# Patient Record
Sex: Female | Born: 1945 | Race: Black or African American | Hispanic: No | Marital: Single | Smoking: Never smoker
Health system: Southern US, Community
[De-identification: ages and names within clinical notes are randomized; demographics above are authoritative.]

## PROBLEM LIST (undated history)

## (undated) DIAGNOSIS — H9319 Tinnitus, unspecified ear: Secondary | ICD-10-CM

## (undated) DIAGNOSIS — M47812 Spondylosis without myelopathy or radiculopathy, cervical region: Secondary | ICD-10-CM

## (undated) DIAGNOSIS — I10 Essential (primary) hypertension: Secondary | ICD-10-CM

## (undated) DIAGNOSIS — H2510 Age-related nuclear cataract, unspecified eye: Secondary | ICD-10-CM

## (undated) DIAGNOSIS — H1045 Other chronic allergic conjunctivitis: Secondary | ICD-10-CM

## (undated) DIAGNOSIS — K429 Umbilical hernia without obstruction or gangrene: Secondary | ICD-10-CM

## (undated) DIAGNOSIS — M5090 Cervical disc disorder, unspecified, unspecified cervical region: Secondary | ICD-10-CM

## (undated) DIAGNOSIS — E440 Moderate protein-calorie malnutrition: Secondary | ICD-10-CM

## (undated) DIAGNOSIS — E785 Hyperlipidemia, unspecified: Secondary | ICD-10-CM

## (undated) DIAGNOSIS — H5203 Hypermetropia, bilateral: Secondary | ICD-10-CM

## (undated) DIAGNOSIS — N393 Stress incontinence (female) (male): Secondary | ICD-10-CM

## (undated) DIAGNOSIS — L659 Nonscarring hair loss, unspecified: Secondary | ICD-10-CM

## (undated) DIAGNOSIS — R011 Cardiac murmur, unspecified: Secondary | ICD-10-CM

## (undated) DIAGNOSIS — M172 Bilateral post-traumatic osteoarthritis of knee: Secondary | ICD-10-CM

## (undated) DIAGNOSIS — N3946 Mixed incontinence: Secondary | ICD-10-CM

## (undated) DIAGNOSIS — J309 Allergic rhinitis, unspecified: Secondary | ICD-10-CM

## (undated) DIAGNOSIS — H40019 Open angle with borderline findings, low risk, unspecified eye: Secondary | ICD-10-CM

## (undated) DIAGNOSIS — E669 Obesity, unspecified: Secondary | ICD-10-CM

## (undated) DIAGNOSIS — M5430 Sciatica, unspecified side: Secondary | ICD-10-CM

## (undated) DIAGNOSIS — D649 Anemia, unspecified: Secondary | ICD-10-CM

## (undated) DIAGNOSIS — H524 Presbyopia: Secondary | ICD-10-CM

## (undated) HISTORY — DX: Obesity, unspecified: E66.9

## (undated) HISTORY — DX: Essential (primary) hypertension: I10

## (undated) HISTORY — DX: Cardiac murmur, unspecified: R01.1

## (undated) HISTORY — PX: KNEE ARTHROSCOPY: SUR90

## (undated) HISTORY — DX: Hypermetropia, bilateral: H52.03

## (undated) HISTORY — DX: Bilateral post-traumatic osteoarthritis of knee: M17.2

## (undated) HISTORY — DX: Allergic rhinitis, unspecified: J30.9

## (undated) HISTORY — DX: Open angle with borderline findings, low risk, unspecified eye: H40.019

## (undated) HISTORY — DX: Other chronic allergic conjunctivitis: H10.45

## (undated) HISTORY — DX: Stress incontinence (female) (male): N39.3

## (undated) HISTORY — DX: Umbilical hernia without obstruction or gangrene: K42.9

## (undated) HISTORY — DX: Presbyopia: H52.4

## (undated) HISTORY — DX: Cervical disc disorder, unspecified, unspecified cervical region: M50.90

## (undated) HISTORY — DX: Spondylosis without myelopathy or radiculopathy, cervical region: M47.812

## (undated) HISTORY — DX: Age-related nuclear cataract, unspecified eye: H25.10

## (undated) HISTORY — DX: Nonscarring hair loss, unspecified: L65.9

## (undated) HISTORY — DX: Sciatica, unspecified side: M54.30

## (undated) HISTORY — DX: Tinnitus, unspecified ear: H93.19

## (undated) HISTORY — DX: Hyperlipidemia, unspecified: E78.5

---
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
## 1994-03-29 ENCOUNTER — Encounter (INDEPENDENT_AMBULATORY_CARE_PROVIDER_SITE_OTHER): Payer: Self-pay | Admitting: *Deleted

## 1994-03-29 LAB — CONVERTED CEMR LAB

## 1997-09-03 ENCOUNTER — Encounter: Admission: RE | Admit: 1997-09-03 | Discharge: 1997-09-03 | Payer: Self-pay | Admitting: Sports Medicine

## 1997-10-12 ENCOUNTER — Encounter: Admission: RE | Admit: 1997-10-12 | Discharge: 1997-10-12 | Payer: Self-pay | Admitting: Family Medicine

## 1997-12-16 ENCOUNTER — Encounter: Admission: RE | Admit: 1997-12-16 | Discharge: 1997-12-16 | Payer: Self-pay | Admitting: Family Medicine

## 1997-12-18 ENCOUNTER — Encounter: Admission: RE | Admit: 1997-12-18 | Discharge: 1997-12-18 | Payer: Self-pay | Admitting: Family Medicine

## 1998-01-01 ENCOUNTER — Encounter: Admission: RE | Admit: 1998-01-01 | Discharge: 1998-01-01 | Payer: Self-pay | Admitting: Family Medicine

## 1998-01-04 ENCOUNTER — Encounter: Admission: RE | Admit: 1998-01-04 | Discharge: 1998-01-04 | Payer: Self-pay | Admitting: Family Medicine

## 1998-01-21 ENCOUNTER — Encounter: Admission: RE | Admit: 1998-01-21 | Discharge: 1998-01-21 | Payer: Self-pay | Admitting: Family Medicine

## 1998-03-04 ENCOUNTER — Encounter: Admission: RE | Admit: 1998-03-04 | Discharge: 1998-03-04 | Payer: Self-pay | Admitting: Family Medicine

## 1998-03-10 ENCOUNTER — Encounter: Admission: RE | Admit: 1998-03-10 | Discharge: 1998-03-10 | Payer: Self-pay | Admitting: Family Medicine

## 1998-09-20 ENCOUNTER — Encounter: Admission: RE | Admit: 1998-09-20 | Discharge: 1998-09-20 | Payer: Self-pay | Admitting: Family Medicine

## 1999-02-23 ENCOUNTER — Encounter: Admission: RE | Admit: 1999-02-23 | Discharge: 1999-02-23 | Payer: Self-pay | Admitting: Family Medicine

## 1999-02-28 ENCOUNTER — Encounter: Admission: RE | Admit: 1999-02-28 | Discharge: 1999-02-28 | Payer: Self-pay | Admitting: Family Medicine

## 1999-03-03 ENCOUNTER — Encounter: Admission: RE | Admit: 1999-03-03 | Discharge: 1999-03-03 | Payer: Self-pay | Admitting: Family Medicine

## 1999-03-30 ENCOUNTER — Encounter: Admission: RE | Admit: 1999-03-30 | Discharge: 1999-03-30 | Payer: Self-pay | Admitting: Family Medicine

## 1999-04-20 ENCOUNTER — Encounter: Admission: RE | Admit: 1999-04-20 | Discharge: 1999-04-20 | Payer: Self-pay | Admitting: Family Medicine

## 1999-05-09 ENCOUNTER — Encounter: Admission: RE | Admit: 1999-05-09 | Discharge: 1999-05-09 | Payer: Self-pay | Admitting: Family Medicine

## 1999-05-18 ENCOUNTER — Encounter: Admission: RE | Admit: 1999-05-18 | Discharge: 1999-05-18 | Payer: Self-pay | Admitting: Family Medicine

## 1999-06-06 ENCOUNTER — Encounter: Admission: RE | Admit: 1999-06-06 | Discharge: 1999-06-06 | Payer: Self-pay | Admitting: Family Medicine

## 2000-12-26 ENCOUNTER — Encounter: Admission: RE | Admit: 2000-12-26 | Discharge: 2000-12-26 | Payer: Self-pay | Admitting: Family Medicine

## 2000-12-28 ENCOUNTER — Encounter: Admission: RE | Admit: 2000-12-28 | Discharge: 2000-12-28 | Payer: Self-pay | Admitting: Family Medicine

## 2001-10-09 ENCOUNTER — Encounter: Admission: RE | Admit: 2001-10-09 | Discharge: 2001-10-09 | Payer: Self-pay | Admitting: Family Medicine

## 2001-10-31 ENCOUNTER — Encounter: Admission: RE | Admit: 2001-10-31 | Discharge: 2001-10-31 | Payer: Self-pay | Admitting: Family Medicine

## 2002-02-28 ENCOUNTER — Encounter: Admission: RE | Admit: 2002-02-28 | Discharge: 2002-02-28 | Payer: Self-pay | Admitting: Family Medicine

## 2002-05-19 ENCOUNTER — Encounter: Admission: RE | Admit: 2002-05-19 | Discharge: 2002-05-19 | Payer: Self-pay | Admitting: Family Medicine

## 2002-10-08 ENCOUNTER — Encounter: Admission: RE | Admit: 2002-10-08 | Discharge: 2002-10-08 | Payer: Self-pay | Admitting: Family Medicine

## 2003-05-19 ENCOUNTER — Encounter: Admission: RE | Admit: 2003-05-19 | Discharge: 2003-05-19 | Payer: Self-pay | Admitting: Sports Medicine

## 2004-04-07 ENCOUNTER — Ambulatory Visit: Payer: Self-pay | Admitting: Family Medicine

## 2004-07-01 ENCOUNTER — Ambulatory Visit: Payer: Self-pay | Admitting: Family Medicine

## 2004-11-29 ENCOUNTER — Emergency Department (HOSPITAL_COMMUNITY): Admission: EM | Admit: 2004-11-29 | Discharge: 2004-11-29 | Payer: Self-pay | Admitting: Emergency Medicine

## 2004-12-14 ENCOUNTER — Ambulatory Visit: Payer: Self-pay | Admitting: Family Medicine

## 2005-05-10 ENCOUNTER — Ambulatory Visit: Payer: Self-pay | Admitting: Family Medicine

## 2005-09-21 ENCOUNTER — Ambulatory Visit: Payer: Self-pay | Admitting: Family Medicine

## 2006-04-24 ENCOUNTER — Ambulatory Visit: Payer: Self-pay | Admitting: Family Medicine

## 2006-06-05 ENCOUNTER — Ambulatory Visit: Payer: Self-pay | Admitting: Family Medicine

## 2006-06-06 ENCOUNTER — Ambulatory Visit: Payer: Self-pay | Admitting: Family Medicine

## 2006-06-06 ENCOUNTER — Encounter (INDEPENDENT_AMBULATORY_CARE_PROVIDER_SITE_OTHER): Payer: Self-pay | Admitting: *Deleted

## 2006-06-06 LAB — CONVERTED CEMR LAB
Albumin: 4 g/dL (ref 3.5–5.2)
CO2: 28 meq/L (ref 19–32)
Chloride: 100 meq/L (ref 96–112)
Creatinine, Ser: 0.99 mg/dL (ref 0.40–1.20)
Direct LDL: 212 mg/dL — ABNORMAL HIGH
Sodium: 140 meq/L (ref 135–145)
Total Bilirubin: 0.6 mg/dL (ref 0.3–1.2)
Total CK: 129 units/L (ref 7–177)

## 2006-07-26 DIAGNOSIS — E669 Obesity, unspecified: Secondary | ICD-10-CM

## 2006-07-26 DIAGNOSIS — E785 Hyperlipidemia, unspecified: Secondary | ICD-10-CM

## 2006-07-26 DIAGNOSIS — J309 Allergic rhinitis, unspecified: Secondary | ICD-10-CM

## 2006-07-26 DIAGNOSIS — I10 Essential (primary) hypertension: Secondary | ICD-10-CM | POA: Insufficient documentation

## 2006-07-26 DIAGNOSIS — N3946 Mixed incontinence: Secondary | ICD-10-CM

## 2006-07-26 DIAGNOSIS — M172 Bilateral post-traumatic osteoarthritis of knee: Secondary | ICD-10-CM

## 2006-07-26 DIAGNOSIS — K429 Umbilical hernia without obstruction or gangrene: Secondary | ICD-10-CM | POA: Insufficient documentation

## 2006-07-26 DIAGNOSIS — E66812 Obesity, class 2: Secondary | ICD-10-CM

## 2006-07-26 DIAGNOSIS — N393 Stress incontinence (female) (male): Secondary | ICD-10-CM

## 2006-07-26 HISTORY — DX: Umbilical hernia without obstruction or gangrene: K42.9

## 2006-07-26 HISTORY — DX: Obesity, class 2: E66.812

## 2006-07-26 HISTORY — DX: Obesity, unspecified: E66.9

## 2006-07-26 HISTORY — DX: Allergic rhinitis, unspecified: J30.9

## 2006-07-26 HISTORY — DX: Bilateral post-traumatic osteoarthritis of knee: M17.2

## 2006-07-26 HISTORY — DX: Essential (primary) hypertension: I10

## 2006-07-26 HISTORY — DX: Mixed incontinence: N39.46

## 2006-07-26 HISTORY — DX: Hyperlipidemia, unspecified: E78.5

## 2006-07-27 ENCOUNTER — Encounter (INDEPENDENT_AMBULATORY_CARE_PROVIDER_SITE_OTHER): Payer: Self-pay | Admitting: *Deleted

## 2006-09-13 ENCOUNTER — Telehealth: Payer: Self-pay | Admitting: *Deleted

## 2006-10-12 ENCOUNTER — Telehealth: Payer: Self-pay | Admitting: *Deleted

## 2006-11-26 ENCOUNTER — Telehealth (INDEPENDENT_AMBULATORY_CARE_PROVIDER_SITE_OTHER): Payer: Self-pay | Admitting: *Deleted

## 2006-12-04 ENCOUNTER — Telehealth (INDEPENDENT_AMBULATORY_CARE_PROVIDER_SITE_OTHER): Payer: Self-pay | Admitting: *Deleted

## 2006-12-05 ENCOUNTER — Telehealth (INDEPENDENT_AMBULATORY_CARE_PROVIDER_SITE_OTHER): Payer: Self-pay | Admitting: *Deleted

## 2007-01-14 ENCOUNTER — Telehealth: Payer: Self-pay | Admitting: Family Medicine

## 2007-07-09 ENCOUNTER — Emergency Department (HOSPITAL_COMMUNITY): Admission: EM | Admit: 2007-07-09 | Discharge: 2007-07-10 | Payer: Self-pay | Admitting: Emergency Medicine

## 2007-10-22 ENCOUNTER — Encounter (INDEPENDENT_AMBULATORY_CARE_PROVIDER_SITE_OTHER): Payer: Self-pay | Admitting: *Deleted

## 2007-10-22 ENCOUNTER — Ambulatory Visit: Payer: Self-pay | Admitting: Family Medicine

## 2007-10-22 DIAGNOSIS — L659 Nonscarring hair loss, unspecified: Secondary | ICD-10-CM | POA: Insufficient documentation

## 2007-10-22 HISTORY — DX: Nonscarring hair loss, unspecified: L65.9

## 2007-10-22 LAB — CONVERTED CEMR LAB
ALT: 15 units/L (ref 0–35)
AST: 20 units/L (ref 0–37)
Alkaline Phosphatase: 136 units/L — ABNORMAL HIGH (ref 39–117)
HCT: 42.2 % (ref 36.0–46.0)
Hemoglobin: 13 g/dL (ref 12.0–15.0)
MCV: 93 fL (ref 78.0–100.0)
Platelets: 288 10*3/uL (ref 150–400)
RDW: 13.4 % (ref 11.5–15.5)
Sodium: 139 meq/L (ref 135–145)
Total Bilirubin: 0.3 mg/dL (ref 0.3–1.2)
WBC: 8.1 10*3/uL (ref 4.0–10.5)

## 2007-10-24 ENCOUNTER — Ambulatory Visit: Payer: Self-pay | Admitting: Family Medicine

## 2007-10-24 ENCOUNTER — Telehealth (INDEPENDENT_AMBULATORY_CARE_PROVIDER_SITE_OTHER): Payer: Self-pay | Admitting: *Deleted

## 2008-01-21 ENCOUNTER — Encounter: Payer: Self-pay | Admitting: Family Medicine

## 2008-03-20 ENCOUNTER — Ambulatory Visit: Payer: Self-pay | Admitting: Family Medicine

## 2008-06-02 ENCOUNTER — Telehealth: Payer: Self-pay | Admitting: Family Medicine

## 2008-06-03 ENCOUNTER — Telehealth: Payer: Self-pay | Admitting: Family Medicine

## 2008-06-24 ENCOUNTER — Ambulatory Visit: Payer: Self-pay | Admitting: Family Medicine

## 2008-06-24 DIAGNOSIS — R011 Cardiac murmur, unspecified: Secondary | ICD-10-CM

## 2008-06-24 HISTORY — DX: Cardiac murmur, unspecified: R01.1

## 2008-06-25 ENCOUNTER — Telehealth: Payer: Self-pay | Admitting: Family Medicine

## 2008-07-01 ENCOUNTER — Encounter: Payer: Self-pay | Admitting: Family Medicine

## 2008-08-17 ENCOUNTER — Telehealth: Payer: Self-pay | Admitting: Family Medicine

## 2008-08-17 ENCOUNTER — Ambulatory Visit: Payer: Self-pay | Admitting: Family Medicine

## 2008-08-17 DIAGNOSIS — H1045 Other chronic allergic conjunctivitis: Secondary | ICD-10-CM

## 2008-08-17 HISTORY — DX: Other chronic allergic conjunctivitis: H10.45

## 2008-08-24 ENCOUNTER — Telehealth: Payer: Self-pay | Admitting: *Deleted

## 2008-08-24 DIAGNOSIS — H9319 Tinnitus, unspecified ear: Secondary | ICD-10-CM | POA: Insufficient documentation

## 2008-08-24 HISTORY — DX: Tinnitus, unspecified ear: H93.19

## 2009-01-04 ENCOUNTER — Encounter: Payer: Self-pay | Admitting: *Deleted

## 2009-09-15 ENCOUNTER — Telehealth: Payer: Self-pay | Admitting: Family Medicine

## 2009-10-29 ENCOUNTER — Ambulatory Visit: Payer: Self-pay | Admitting: Family Medicine

## 2010-02-07 ENCOUNTER — Ambulatory Visit: Payer: Self-pay | Admitting: Family Medicine

## 2010-02-07 ENCOUNTER — Encounter: Payer: Self-pay | Admitting: Family Medicine

## 2010-02-07 LAB — CONVERTED CEMR LAB
ALT: 12 units/L (ref 0–35)
AST: 21 units/L (ref 0–37)
CO2: 26 meq/L (ref 19–32)
Cholesterol: 230 mg/dL — ABNORMAL HIGH (ref 0–200)
Glucose, Bld: 87 mg/dL (ref 70–99)
LDL Cholesterol: 143 mg/dL — ABNORMAL HIGH (ref 0–99)
Sodium: 140 meq/L (ref 135–145)
Total Bilirubin: 0.5 mg/dL (ref 0.3–1.2)
Total Protein: 7.4 g/dL (ref 6.0–8.3)
VLDL: 30 mg/dL (ref 0–40)

## 2010-02-09 ENCOUNTER — Encounter: Payer: Self-pay | Admitting: Family Medicine

## 2010-02-09 ENCOUNTER — Ambulatory Visit: Payer: Self-pay | Admitting: Family Medicine

## 2010-02-18 ENCOUNTER — Telehealth: Payer: Self-pay | Admitting: Family Medicine

## 2010-02-21 ENCOUNTER — Telehealth: Payer: Self-pay | Admitting: Family Medicine

## 2010-02-24 ENCOUNTER — Emergency Department (HOSPITAL_COMMUNITY): Admission: EM | Admit: 2010-02-24 | Discharge: 2010-02-24 | Payer: Self-pay | Admitting: Family Medicine

## 2010-03-09 ENCOUNTER — Telehealth: Payer: Self-pay | Admitting: Family Medicine

## 2010-03-10 ENCOUNTER — Encounter: Payer: Self-pay | Admitting: Family Medicine

## 2010-04-14 ENCOUNTER — Telehealth (INDEPENDENT_AMBULATORY_CARE_PROVIDER_SITE_OTHER): Payer: Self-pay | Admitting: *Deleted

## 2010-05-12 ENCOUNTER — Ambulatory Visit: Payer: Self-pay | Admitting: Family Medicine

## 2010-05-12 DIAGNOSIS — M5430 Sciatica, unspecified side: Secondary | ICD-10-CM

## 2010-05-12 HISTORY — DX: Sciatica, unspecified side: M54.30

## 2010-05-16 ENCOUNTER — Ambulatory Visit: Payer: Self-pay

## 2010-05-17 ENCOUNTER — Telehealth: Payer: Self-pay | Admitting: Family Medicine

## 2010-06-15 ENCOUNTER — Ambulatory Visit: Admit: 2010-06-15 | Payer: Self-pay | Admitting: Family Medicine

## 2010-06-23 ENCOUNTER — Encounter (INDEPENDENT_AMBULATORY_CARE_PROVIDER_SITE_OTHER): Payer: Self-pay | Admitting: *Deleted

## 2010-06-28 NOTE — Progress Notes (Signed)
Summary: refills  Phone Note Call from Patient   Caller: Patient Call For: [phone] Summary of Call: Please fax pharmacy request to refill patients bp meds because she is going out of town on Friday and need before she leaves.  Also she want results of labs she did last month.  Please call her with that info or put in mail. Initial call taken by: Abundio Miu,  March 09, 2010 1:37 PM    Prescriptions: CRESTOR 10 MG TABS (ROSUVASTATIN CALCIUM) 1 tablet by mouth daily  #30 x 0   Entered and Authorized by:   Alvia Grove DO   Signed by:   Alvia Grove DO on 03/10/2010   Method used:   Electronically to        [facility] (retail)       [address]       [place], [place]  [address]       Ph: [phone]       Fax: [phone]   RxID:   [id number] ZESTORETIC 20-25 MG TABS (LISINOPRIL-HYDROCHLOROTHIAZIDE) 1 by mouth once daily for blood pressure  #30 x 0   Entered and Authorized by:   Alvia Grove DO   Signed by:   Alvia Grove DO on 03/10/2010   Method used:   Electronically to        [facility] (retail)       [address]       [place], [place]  [address]       Ph: [phone]       Fax: [phone]   RxID:   [id number]

## 2010-06-28 NOTE — Assessment & Plan Note (Signed)
Summary: flu shot, df  Nurse Visit    Prior Medications: FLONASE 50 MCG/ACT SUSP (FLUTICASONE PROPIONATE) Spray 2 spray into both nostrils once a day LISINOPRIL-HYDROCHLOROTHIAZIDE 20-25 MG  TABS (LISINOPRIL-HYDROCHLOROTHIAZIDE) 1 by mouth once daily ULTRAM 50 MG TABS (TRAMADOL HCL) Take 1 tablet by mouth every six hours CRESTOR 10 MG TABS (ROSUVASTATIN CALCIUM) 1 tablet by mouth daily DICLOFENAC SODIUM 75 MG TBEC (DICLOFENAC SODIUM) 1 by mouth two times a day as needed.  last refill before visit with primary doctor Current Allergies: AMOXICILLIN (AMOXICILLIN) * LIPITOR/ZOCOR TETANUS-DIPHTHERIA TOXOIDS TD (TETANUS-DIPHTHERIA TOXOIDS TD)   Influenza Vaccine    Vaccine Type: Fluvax 3+    Site: left deltoid    Mfr: GlaxoSmithKline    Dose: 0.5 ml    Route: IM    Given by: Liahm Grivas LPN    Exp. Date: 11/25/2008    Lot #: [id number]    VIS given: 12/20/06 version given March 20, 2008.  Flu Vaccine Consent Questions    Do you have a history of severe allergic reactions to this vaccine? no    Any prior history of allergic reactions to egg and/or gelatin? no    Do you have a sensitivity to the preservative Thimersol? no    Do you have a past history of Guillan-Barre Syndrome? no    Do you currently have an acute febrile illness? no    Have you ever had a severe reaction to latex? no    Vaccine information given and explained to patient? yes    Are you currently pregnant? no   Orders Added: 1)  Flu Vaccine 37yrs + [90658] 2)  Admin 1st Vaccine [90471] 3)  Est Level 1- [facility] [33295]    ]

## 2010-06-28 NOTE — Progress Notes (Signed)
Summary: phone note       Additional Follow-up for Phone Call Additional follow up Details #2::    she was concernd that the site where she got the zostavax was itchy & red. told her this is a local reax & will go away. may use OTC antiitch med for the area. she was satisfied with the answer Follow-up by: Golden Circle RN,  February 18, 2010 2:55 PM  Noted

## 2010-06-28 NOTE — Progress Notes (Signed)
Summary: triage  Phone Note Call from Patient Call back at [phone]   Caller: Patient Summary of Call: fell on knee yesterday - doesn't think it's broken but needs to know if she can go to UC in case they want to do xrays Initial call taken by: De Nurse,  February 21, 2010 9:33 AM  Follow-up for Phone Call        can weight bear, but using crutches. calf & knee hurt. she does not think it is broken. waiting for a ride & will use UC as we have no appts left here Follow-up by: Golden Circle RN,  February 21, 2010 10:23 AM    Agree

## 2010-06-28 NOTE — Miscellaneous (Signed)
Summary: Zostavax  Zostavax   Imported By: Clydell Hakim 02/14/2010 16:16:56  _____________________________________________________________________  External Attachment:    Type:   Image     Comment:   External Document

## 2010-06-28 NOTE — Progress Notes (Signed)
Summary: absolutely last RF until appt - Rx Req  Phone Note Refill Request Call back at [phone] Message from:  Patient  Refills Requested: Medication #1:  ZESTORETIC 20-25 MG TABS 1 by mouth once daily for blood pressure  Medication #2:  DICLOFENAC SODIUM 50 MG TBEC 1 by mouth two times a day for arthritis pain. PT WONDERING IF SHE CAN GET THESE REFILLED UNTIL SHE CAN MAKE AN APPOINTMENT IN MAY DUE TO FINIANCES.  PT HAS NOT HAD ANY SINCE MONDAY.  Initial call taken by: Clydell Hakim,  September 15, 2009 3:16 PM  Follow-up for Phone Call        to pcp Follow-up by: Theresia Lo RN,  September 15, 2009 4:22 PM  Additional Follow-up for Phone Call Additional follow up Details #1::        will refill for one month. absolutely last refill before seen.  she hasn't been seen since March 2010.  please send to her pharmacy Additional Follow-up by: Ancil Boozer  MD,  September 15, 2009 5:26 PM    Prescriptions: DICLOFENAC SODIUM 50 MG TBEC (DICLOFENAC SODIUM) 1 by mouth two times a day for arthritis pain  #60 x 0   Entered by:   Theresia Lo RN   Authorized by:   Ancil Boozer  MD   Signed by:   Theresia Lo RN on 09/16/2009   Method used:   Electronically to        [facility] (retail)       [address]       [place], [place]  [id number]       Ph: [phone]       Fax: [phone]   RxID:   [id number] ZESTORETIC 20-25 MG TABS (LISINOPRIL-HYDROCHLOROTHIAZIDE) 1 by mouth once daily for blood pressure  #30 x 0   Entered by:   Theresia Lo RN   Authorized by:   Ancil Boozer  MD   Signed by:   Theresia Lo RN on 09/16/2009   Method used:   Electronically to        [facility] ([facility]ail)       [address]       [place], [place]  [address]       Ph: [phone]       Fax: [phone]   RxID:   [id number]  patient notified. she will call for appointment. Theresia Lo RN  September 16, 2009 10:49 AM

## 2010-06-28 NOTE — Assessment & Plan Note (Signed)
Summary: f/up,tcb   Vital Signs:  Patient profile:   65 year old female Height:      63.5 inches Weight:      219 pounds BMI:     38.32 BSA:     2.02 Temp:     97.9 degrees F Pulse rate:   99 / minute BP sitting:   147 / 87  Vitals Entered By: Jone Baseman CMA (October 29, 2009 11:09 AM) CC: f/u htn, obesity, joint pains Is Patient Diabetic? No Pain Assessment Patient in pain? no        Primary Care Provider:  Ancil Boozer  MD  CC:  f/u htn, obesity, and joint pains.  History of Present Illness: HTN: patient states she has been out of her meds.  she is mad she had to come in to get them even though she hasn't been seen for her blood pressure for >1year.  denies having problems with meds - no dizziness, no chest pains, no shortness of breath, no swelling in legs.  brought in readings from last few days of blood pressures - typically 110s/70s.   obesity: is requesting referral to nutritionist.  states she has been walking some and has a treadmill.  also she [facility] for other [facility] options given her joint pains.  joint pains: inhibiting some of her exercise goals.  primarily in knees.  is thinking about getting back to the orthopedist to discuss synvisc injections.  using the diclofenac which helps some in addition to tylenol.  tramadol wasn't helping much so she stopped it and when stopped it had side effects so never wants to be on this med again.  Habits & Providers  Alcohol-Tobacco-Diet     Tobacco Status: never  Current Medications (verified): 1)  Flonase 50 Mcg/act Susp (Fluticasone Propionate) .... Spray 2 Spray Into Both Nostrils Once A Day 2)  Zestoretic 20-25 Mg Tabs (Lisinopril-Hydrochlorothiazide) .Marland Kitchen.. 1 By Mouth Once Daily For Blood Pressure 3)  Crestor 10 Mg Tabs (Rosuvastatin Calcium) .Marland Kitchen.. 1 Tablet By Mouth Daily 4)  Diclofenac Sodium 75 Mg Tbec (Diclofenac Sodium) .Marland Kitchen.. 1 By Mouth Up To Two Times A Day As Needed Pain 5)  Tylenol  Extra Strength 500 Mg Tabs (Acetaminophen) .... 2 Tabs Up To Three Times A Day As Needed  Allergies: 1)  Amoxicillin (Amoxicillin) 2)  * Lipitor/zocor 3)  Tetanus-Diphtheria Toxoids Td (Tetanus-Diphtheria Toxoids Td)  Past History:  Past medical, surgical, family and social histories (including risk factors) reviewed for relevance to current acute and chronic problems.  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 L knee arthroscopy `93 -  Family History: Reviewed history from 10/22/2007 and no changes required. B, S:  asthma, allergies, F:  died age 66, cause unknown (had schizoprhenia)., M:  DM, CVA, HTN, PAD  Social History: Reviewed history from 10/22/2007 and no changes required. 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  Lives alone, son in area.  No tob/EtOH/drugs.  has a new puppy as of 10/2009.  Review of Systems       per HPI.  briefly mentions she is having  some occasional right shoulder pain. she wonders if it is an old rotator cuff tear acting up.   Physical Exam  General:  Well-developed,well-nourished,in no acute distress; alert,appropriate and cooperative throughout examination obese VS noted. mildly hypertensive Lungs:  Normal respiratory effort, chest expands symmetrically. Lungs are clear to auscultation, no crackles or wheezes. Heart:  Normal rate and regular rhythm. S1 and S2 normal without gallop, murmur, click, rub or other extra sounds. Extremities:  no  edema   Impression & Recommendations:  Problem # 1:  HYPERTENSION, BENIGN SYSTEMIC (ICD-401.1) Assessment Unchanged  home numbers are at goal.  refilled med.  ordered future lab to check kidney fxn.   Her updated medication list for this problem includes:    Zestoretic 20-25 Mg Tabs (Lisinopril-hydrochlorothiazide) .Marland Kitchen... 1 by mouth once daily for blood pressure  Orders: [facility]- Est  Level 4 ([address])Future Orders: Comp Met-[facility] ([id number]) ... 11/16/2010  BP today: 147/87 Prior BP: 159/79 (08/17/2008)  Labs Reviewed: K+: 4.9 (10/22/2007) Creat: : 0.97 (10/22/2007)     Problem # 2:  HYPERLIPIDEMIA (ICD-272.4) Assessment: Unchanged due for check of lipid panel.  encouraged nutrition visit.  order in place for future FLP.   Her updated medication list for this problem includes:    Crestor 10 Mg Tabs (Rosuvastatin calcium) .Marland Kitchen... 1 tablet by mouth daily  Orders: [facility]- Est  Level 4 ([address])Future Orders: Lipid-[facility] ([address]) ... 11/03/2010  Problem # 3:  OSTEOARTHRITIS, LOWER LEG (ICD-715.96) Assessment: Unchanged increased strenght of diclofenac.  informed she can take tylenol more often than she has been.  okay to contact previous orthopedist to discsuss possible joint injections.   consider low impact exercises  The following medications were removed from the medication list:    Ultram 50 Mg Tabs (Tramadol hcl) .Marland Kitchen... Take 1 tablet by mouth every six hours Her updated medication list for this problem includes:    Diclofenac Sodium 75 Mg Tbec (Diclofenac sodium) .Marland Kitchen... 1 by mouth up to two times a day as needed pain    Tylenol Extra Strength 500 Mg Tabs (Acetaminophen) .Marland Kitchen... 2 tabs up to three times a day as needed  Orders: [facility]- Est  Level 4 ([address]562)  Problem # 4:  OBESITY, NOS (ICD-278.00) Assessment: Unchanged encouraged nutrition visit as she wants.  no added salt, look for items with low sodium  Orders: [facility]- Est  Level 4 ([address])  Complete Medication List: 1)   Flonase 50 Mcg/act Susp (Fluticasone propionate) .... Spray 2 spray into both nostrils once a day 2)  Zestoretic 20-25 Mg Tabs (Lisinopril-hydrochlorothiazide) .Marland Kitchen.. 1 by mouth once daily for blood pressure 3)  Crestor 10 Mg Tabs (Rosuvastatin calcium) .Marland Kitchen.. 1 tablet by mouth daily 4)  Diclofenac Sodium 75 Mg Tbec (Diclofenac sodium) .Marland Kitchen.. 1 by mouth up to two times a day as needed pain 5)  Tylenol Extra Strength 500 Mg Tabs (Acetaminophen) .... 2 tabs up to three times a day as needed  Patient Instructions: 1)  Please set up an appt when you check out for a morning lab draw and also with Dr Gerilyn Pilgrim to discuss nutrition.  On the same day as your lab test you can get your TB skin test done as well. 2)  You are due for many preventative care items including: mammogram, pap smear, colonoscopy, pneumonia vaccine and shingles vaccine.  Let me know if you are interested in getting caught up on these things. 3)  It was nice to see you today! Don't be a stranger! Prescriptions: CRESTOR 10 MG TABS (ROSUVASTATIN  CALCIUM) 1 tablet by mouth daily  #30 x 3   Entered and Authorized by:   Ancil Boozer  MD   Signed by:   Ancil Boozer  MD on 10/29/2009   Method used:   Electronically to        [facility] ([facility]ail)       [address]       [place], [place]  [address]       Ph: [phone]       Fax: [phone]   RxID:   [id number] ZESTORETIC 20-25 MG TABS (LISINOPRIL-HYDROCHLOROTHIAZIDE) 1 by mouth once daily for blood pressure  #30 x 3   Entered and Authorized by:   Ancil Boozer  MD   Signed by:   Ancil Boozer  MD on 10/29/2009   Method used:   Electronically to        [facility] ([facility]ail)       [address]       [place], [place]  [id number]       Ph: [phone]       Fax: [phone]   RxID:   [id number] FLONASE 50 MCG/ACT SUSP (FLUTICASONE PROPIONATE) Spray 2 spray into both nostrils once a day  #1 x 6   Entered and Authorized by:    Ancil Boozer  MD   Signed by:   Ancil Boozer  MD on 10/29/2009   Method used:   Electronically to        [facility] ([facility]ail)       [address]       [place], [place]  [id number]       Ph: [phone]       Fax: [phone]   RxID:   [id number] DICLOFENAC SODIUM 75 MG TBEC (DICLOFENAC SODIUM) 1 by mouth up to two times a day as needed pain  #60 x 3   Entered and Authorized by:   Ancil Boozer  MD   Signed by:   Ancil Boozer  MD on 10/29/2009   Method used:   Electronically to        [facility] ([facility]ail)       [address]       [place], [place]  [address]       Ph: [phone]       Fax: [phone]   RxID:   [id number]   Prevention & Chronic Care Immunizations   Influenza vaccine: Fluvax 3+  (03/20/2008)   Influenza vaccine due: 03/20/2009    Tetanus booster: Not documented   Td booster deferral: Contraindicated  (10/29/2009)    Pneumococcal vaccine: Not documented   Pneumococcal vaccine deferral: Deferred  (10/29/2009)    H. zoster vaccine: Not documented   H. zoster vaccine deferral: Deferred  (10/29/2009)  Colorectal Screening   Hemoccult: Not documented    Colonoscopy: Not documented  Other Screening   Pap smear: Done.  (03/29/1994)   Pap smear due: 03/29/1997    Mammogram: Not documented    DXA bone density scan: Not documented   Smoking status: never  (10/29/2009)    Screening comments: patient states she knows she is due for everything but she isn't sure she wants them yet.  Lipids   Total Cholesterol: Not documented   LDL: Not documented   LDL Direct: 212  (06/06/2006)   HDL: Not documented   Triglycerides: Not documented    SGOT (AST): 20  (10/22/2007)  SGPT (ALT): 15  (10/22/2007) CMP ordered    Alkaline phosphatase: 136  (10/22/2007)   Total bilirubin: 0.3  (10/22/2007)    Lipid flowsheet reviewed?: Yes   Progress toward LDL goal: Unchanged   Lipid comments: future  lipid panel ordered.  patient is also thinking about getting nutrition visit  Hypertension   Last Blood Pressure: 147 / 87  (10/29/2009)   Serum creatinine: 0.97  (10/22/2007)   Serum potassium 4.9  (10/22/2007) CMP ordered     Hypertension flowsheet reviewed?: Yes   Progress toward BP goal: Unchanged   Hypertension comments: patient states she has been out of her medications.   Self-Management Support :   Personal Goals (by the next clinic visit) :      Personal blood pressure goal: 140/90  (10/29/2009)     Personal LDL goal: 130  (10/29/2009)    Hypertension self-management support: BP self-monitoring log, Written self-care plan, Referred for medical nutrition therapy  (10/29/2009)   Hypertension self-care plan printed.    Lipid self-management support: Lipid monitoring log, Written self-care plan, Referred for medical nutrition therapy  (10/29/2009)   Lipid self-care plan printed.    Self-management comments: refilled medications (took threat to not refill to get her to come in). future lab orders in place. referred to nutrition therapy.  patient brought in logs of blood pressures, etc.

## 2010-06-28 NOTE — Assessment & Plan Note (Signed)
Summary: READ PPD/KH  Nurse Visit  consulted Dr. [place] and she advised ok to give zostavax today. Dr. Sandi Mealy had recommended to patient to get the vaccine at her last office visit. Theresia Lo RN  February 09, 2010 10:42 AM   Vital Signs:  Patient profile:   65 year old female Temp:     98.6 degrees F  Vitals Entered By: Theresia Lo RN (February 09, 2010 10:41 AM)  Allergies: 1)  Amoxicillin (Amoxicillin) 2)  * Lipitor/zocor 3)  Tetanus-Diphtheria Toxoids Td (Tetanus-Diphtheria Toxoids Td)  Immunizations Administered:  Zostavax # 1:    Vaccine Type: Zostavax    Site: left arm    Mfr: Merck    Dose: 0.65 ml    Route: [place]    Given by: Theresia Lo RN    Exp. Date: 06/12/2010    Lot #: [id number]    VIS given: 03/10/05 given February 09, 2010.  PPD Results    Date of reading: 02/09/2010    Results: 0 mm    Interpretation: negative  Orders Added: 1)  Zoster (Shingles) Vaccine Live [90736] 2)  Admin 1st Vaccine [90471]     Vital Signs:  Patient profile:   65 year old female Temp:     98.6 degrees F  Vitals Entered By: Theresia Lo RN (February 09, 2010 10:41 AM)  cell # for call back about labs [phone]. patient wants to know results of recent labs.  flag sent to Dr. Gomez Cleverly . Theresia Lo RN  February 09, 2010 12:16 PM

## 2010-06-28 NOTE — Letter (Signed)
Summary: Lipid Letter  [facility]  [address]   [place], [place] [address]   Phone: [phone]  Fax: [phone]    03/10/2010  Sarah Moody [address] [place], [place]  [address]  Dear [name]:  We have carefully reviewed your last lipid profile from 02/07/2010 and the results are noted below with a summary of recommendations for lipid management.    Cholesterol:       230     Goal: < 200   HDL "good" Cholesterol:   57     Goal: > 40   LDL "bad" Cholesterol:   143     Goal: < 120   Triglycerides:       151     Goal: < 200        TLC Diet (Therapeutic Lifestyle Change): Saturated Fats & Transfatty acids should be kept < 7% of total calories ***Reduce Saturated Fats Polyunstaurated Fat can be up to 10% of total calories Monounsaturated Fat Fat can be up to 20% of total calories Total Fat should be no greater than 25-35% of total calories Carbohydrates should be 50-60% of total calories Protein should be approximately 15% of total calories Fiber should be at least 20-30 grams a day ***Increased fiber may help lower LDL Total Cholesterol should be < 200mg /day Consider adding plant stanol/sterols to diet (example: Benacol spread) ***A higher intake of unsaturated fat may reduce Triglycerides and Increase HDL    Adjunctive Measures (may lower LIPIDS and reduce risk of Heart Attack) include: Aerobic Exercise (20-30 minutes 3-4 times a week) Limit Alcohol Consumption Weight Reduction Aspirin 75-81 mg a day by mouth (if not allergic or contraindicated) Dietary Fiber 20-30 grams a day by mouth     Current Medications: 1)    Flonase 50 Mcg/act Susp (Fluticasone propionate) .... Spray 2 spray into both nostrils once a day 2)    Zestoretic 20-25 Mg Tabs (Lisinopril-hydrochlorothiazide) .Marland Kitchen.. 1 by mouth once daily for blood pressure 3)    Crestor 10 Mg Tabs (Rosuvastatin calcium) .Marland Kitchen.. 1 tablet by mouth daily 4)    Diclofenac Sodium 75 Mg Tbec  (Diclofenac sodium) .Marland Kitchen.. 1 by mouth up to two times a day as needed pain 5)    Tylenol Extra Strength 500 Mg Tabs (Acetaminophen) .... 2 tabs up to three times a day as needed  I have refilled your blood pressure and cholesterol medicine for you for one month.  Please make an appointment before you run out of medicines so we can review your labs together and make changes to your medicines if needed.   If you have any questions, please call. We appreciate being able to work with you.   Sincerely,    [facility] Alvia Grove DO  Appended Document: Lipid Letter mailed.

## 2010-06-28 NOTE — Assessment & Plan Note (Signed)
Summary: tb test and shingle shot/bmc  Nurse Visit   Allergies: 1)  Amoxicillin (Amoxicillin) 2)  * Lipitor/zocor 3)  Tetanus-Diphtheria Toxoids Td (Tetanus-Diphtheria Toxoids Td)  Immunizations Administered:  PPD Skin Test:    Vaccine Type: PPD    Site: left forearm    Mfr: Sanofi Pasteur    Dose: 0.1 ml    Route: ID    Given by: Theresia Lo RN    Exp. Date: 03/31/2011    Lot #: [id number]  Orders Added: 1)  TB Skin Test [86580] 2)  Admin 1st Vaccine [40102]

## 2010-06-28 NOTE — Progress Notes (Signed)
Summary: refill  Phone Note Refill Request Call back at [phone] Message from:  Patient  Refills Requested: Medication #1:  ZESTORETIC 20-25 MG TABS 1 by mouth once daily for blood pressure [facility]  Next Appointment Scheduled: 12/19 Initial call taken by: De Nurse,  April 14, 2010 9:19 AM  Follow-up for Phone Call        pt checked on meds and still doesn't have it pt is out Follow-up by: De Nurse,  April 18, 2010 3:02 PM  Additional Follow-up for Phone Call Additional follow up Details #1::         RX sent to pharmacy and patient notified. Additional Follow-up by: Theresia Lo RN,  April 18, 2010 5:11 PM

## 2010-06-30 NOTE — Assessment & Plan Note (Signed)
Summary: back/leg pain,df   Vital Signs:  Patient profile:   65 year old female Temp:     98.7 degrees F Pulse rate:   105 / minute BP sitting:   126 / 77  Vitals Entered By: Jone Baseman CMA (May 12, 2010 10:05 AM) CC: Right Hip and leg Is Patient Diabetic? No Pain Assessment Patient in pain? yes     Location: right hip and leg Intensity: 10   Primary Care Provider:  Alvia Grove DO  CC:  Right Hip and leg.  History of Present Illness: Pt. reports chronic arthritis.  Considering Synvisc in knee.  Was pulled from porch approx. 1 month ago and has had pain in left hip and leg.  Starts in back and radiates thorugh leg.  She is using Ultram as needed but it was not helping so she stopped that.  She is using heat and resting as much as possible.  She thought it would be better by now but improvement has been slow.  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 1 By Mouth Once Daily For Blood Pressure 2)  Crestor 10 Mg Tabs (Rosuvastatin Calcium) .Marland Kitchen.. 1 Tablet By Mouth Daily 3)  Diclofenac Sodium 75 Mg Tbec (Diclofenac Sodium) .Marland Kitchen.. 1 By Mouth Up To Two Times A Day As Needed Pain 4)  Tylenol Extra Strength 500 Mg Tabs (Acetaminophen) .... 2 Tabs Up To Three Times A Day As Needed 5)  Tramadol Hcl 50 Mg Tabs (Tramadol Hcl) .Marland Kitchen.. 1 By Mouth Q 6 Hours As Needed  Allergies (verified): 1)  Amoxicillin (Amoxicillin) 2)  * Lipitor/zocor 3)  Tetanus-Diphtheria Toxoids  Td (Tetanus-Diphtheria Toxoids Td)  Past History:  Past Medical History: Last updated: 2009/10/31 ALOPECIA (ICD-704.00) as a side effect of medication UMBILICAL HERNIA (ICD-553.1)  RHINITIS, ALLERGIC (ICD-477.9) OSTEOARTHRITIS, LOWER LEG (ICD-715.96) OBESITY, NOS (ICD-278.00) INCONTINENCE, STRESS, FEMALE (ICD-625.6) HYPERTENSION, BENIGN SYSTEMIC (ICD-401.1) HYPERLIPIDEMIA (ICD-272.4)  Past Surgical History: Last updated: 07/26/2006 L knee arthroscopy `93 -  Family History: Last updated: 10/22/2007 B, S:  asthma, allergies, F:  died age 9, cause unknown (had schizoprhenia)., M:  DM, CVA, HTN, PAD  Social History: Last updated: 10/31/09 Daughter - Angelique Blonder - died winter 2003.Marland Kitchen Pt is a  Runner, 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  Lives alone, son in area.  No tob/EtOH/drugs.  has a new puppy as of 10/2009.  Risk Factors: Smoking Status: never (10-31-2009)  Review of Systems  The patient denies fever, chest pain, peripheral edema, abdominal pain, hematochezia, and severe indigestion/heartburn.    Physical Exam  General:  alert and well-developed.   Head:  normocephalic and atraumatic.   Neck:  supple.   Lungs:  normal respiratory effort.   Heart:  normal rate.   Abdomen:  soft and non-tender.   Msk:  normal ROM.   Extremities:  No clubbing, cyanosis, or deformity noted with normal full range of motion of all joints.  trace left pedal edema and trace right pedal  edema.     Impression & Recommendations:  Problem # 1:  SCIATICA (ICD-724.3) Continue rest.  Exercises. Heat.  Consider PT if no improvement.  Her updated medication list for this problem includes:    Diclofenac Sodium 75 Mg Tbec (Diclofenac sodium) .Marland Kitchen... 1 by mouth up to two times a day as needed pain    Tylenol Extra Strength 500 Mg Tabs (Acetaminophen) .Marland Kitchen... 2 tabs up to three times a day as needed    Tramadol Hcl 50  Mg Tabs (Tramadol hcl) .Marland Kitchen... 1 by mouth q 6 hours as needed  Orders: [facility]- Est Level  3 (60454)  Problem # 2:  OBESITY, NOS (ICD-278.00) Pt. requests referral. Orders: [facility]- Est Level  3 (09811) Nutrition Referral (Nutrition)  Problem # 3:  HYPERLIPIDEMIA (ICD-272.4)  Her updated medication list for this problem includes:    Crestor 10 Mg Tabs (Rosuvastatin calcium) .Marland Kitchen... 1 tablet by mouth daily  Complete Medication List: 1)  Zestoretic 20-25 Mg Tabs (Lisinopril-hydrochlorothiazide) .Marland Kitchen.. 1 by mouth once daily for blood pressure 2)  Crestor 10 Mg Tabs (Rosuvastatin calcium) .Marland Kitchen.. 1 tablet by mouth daily 3)  Diclofenac Sodium 75 Mg Tbec (Diclofenac sodium) .Marland Kitchen.. 1 by mouth up to two times a day as needed pain 4)  Tylenol Extra Strength 500 Mg Tabs (Acetaminophen) .... 2 tabs up to three times a day as needed 5)  Tramadol Hcl 50 Mg Tabs (Tramadol hcl) .Marland Kitchen.. 1 by mouth q 6 hours as needed  Patient Instructions: 1)  Please schedule a follow-up appointment in 2 months.  Prescriptions: DICLOFENAC SODIUM 75 MG TBEC (DICLOFENAC SODIUM) 1 by mouth up to two times a day as needed pain  #60 x 3   Entered and Authorized by:   Tinnie Gens MD   Signed by:   Tinnie Gens MD on 05/12/2010   Method used:   Electronically to        [facility] (retail)       [address]       [place], [place]  [id number]       Ph: [phone]       Fax: [phone]   RxID:   [id number] CRESTOR 10 MG TABS (ROSUVASTATIN CALCIUM) 1 tablet by mouth daily  #30 x 3   Entered and Authorized by:   Tinnie Gens MD   Signed by:   Tinnie Gens MD on 05/12/2010   Method used:   Electronically to        [facility] (retail)       [address]       [place], [place]  [id number]       Ph: [phone]       Fax: [phone]   RxID:   [id number] ZESTORETIC 20-25 MG TABS (LISINOPRIL-HYDROCHLOROTHIAZIDE) 1 by mouth once daily for blood pressure  #31 x 3   Entered and  Authorized by:   Tinnie Gens MD   Signed by:   Tinnie Gens MD on 05/12/2010   Method used:   Electronically to        [facility] [address] (retail)       [address]       [place], [place]  [id number]       Ph: [phone]       Fax: [phone]   RxID:   [id number] TRAMADOL HCL 50 MG TABS (TRAMADOL HCL) 1 by mouth q 6 hours as needed  #42 x 2  Entered and Authorized by:   Tinnie Gens MD   Signed by:   Tinnie Gens MD on 05/12/2010   Method used:   Electronically to        [facility] (retail)       [address]       [place], [place]  [id number]       Ph: [phone]       Fax: [phone]   RxID:   [id number]    Orders Added: 1)  [facility]- Est Level  3 [18299] 2)  Nutrition Referral [Nutrition]

## 2010-06-30 NOTE — Progress Notes (Signed)
  Phone Note Call from Patient   Caller: Patient Call For: [phone] Summary of Call: Ms. Lusher is calling for another rx for pain.  Tramadol is not working at all.  She is still having a lot of pain to her hip/back area.  Unable to perform chores at home.  [facility] [address] . Initial call taken by: Abundio Miu,  May 17, 2010 2:09 PM  Follow-up for Phone Call        will forward message to Dr. Leveda Anna preceptor today. Follow-up by: Theresia Lo RN,  May 17, 2010 2:13 PM  Additional Follow-up for Phone Call Additional follow up Details #1::        spoke with patient and she states the above number is not even her number . will  forward back to Dr. Leveda Anna to call at [phone]. Additional Follow-up by: Theresia Lo RN,  May 17, 2010 4:21 PM    Additional Follow-up for Phone Call Additional follow up Details #2::    Called and discussed.  Will give vicodin now.  Advised to make an appointment with Dr. Shawnie Pons to discuss long term plan Follow-up by: Doralee Albino MD,  May 17, 2010 5:08 PM  New/Updated Medications: HYDROCODONE-ACETAMINOPHEN 5-500 MG TABS (HYDROCODONE-ACETAMINOPHEN) one by mouth q6h as needed pain Prescriptions: HYDROCODONE-ACETAMINOPHEN 5-500 MG TABS (HYDROCODONE-ACETAMINOPHEN) one by mouth q6h as needed pain  #40 x 1   Entered and Authorized by:   Doralee Albino MD   Signed by:   Doralee Albino MD on 05/17/2010   Method used:   Print then Give to Patient   RxID:   [id number]

## 2010-06-30 NOTE — Letter (Signed)
Summary: Generic Letter  [facility]  [address]   [place], [place] [address]   Phone: [phone]  Fax: [phone]    06/23/2010  [address] [place], [place]  [address]  Dear [name],  We are happy to let you know that since you are covered under Medicare you are able to have a FREE visit at the [facility] to discuss your HEALTH. This is a new benefit for Medicare.  There will be no co-payment.  At this visit you will meet with Sarah [facility] an expert in wellness and the health coach at our clinic.  At this visit we will discuss ways to keep you healthy and feeling well.  This visit will not replace your regular doctor visit and we cannot refill medications.     You will need to plan to be here at least one hour to talk about your medical history, your current status, review all of your medications, and discuss your future plans for your health.  This information will be entered into your record for your doctor to have and review.  If you are interested in staying healthy, this type of visit can help.  Please call the office at: [phone], to schedule a "Medicare Wellness Visit".  The day of the visit you should bring in all of your medications, including any vitamins, herbs, over the counter products you take.  Make a list of all the other doctors that you see, so we know who they are. If you have any other health documents please bring them.  We look forward to helping you stay healthy.  Sincerely,   [facility]  [facility]

## 2010-09-17 ENCOUNTER — Other Ambulatory Visit: Payer: Self-pay | Admitting: Family Medicine

## 2010-09-18 NOTE — Telephone Encounter (Signed)
Refill request

## 2010-11-08 ENCOUNTER — Telehealth: Payer: Self-pay | Admitting: Family Medicine

## 2010-11-08 NOTE — Telephone Encounter (Signed)
Want to discuss what the wellness visit is about

## 2010-11-17 ENCOUNTER — Ambulatory Visit: Payer: Self-pay | Admitting: Family Medicine

## 2010-12-22 ENCOUNTER — Other Ambulatory Visit: Payer: Self-pay | Admitting: Family Medicine

## 2011-01-12 NOTE — Telephone Encounter (Signed)
Spoke with pt about annual wellness visit

## 2011-01-22 ENCOUNTER — Other Ambulatory Visit: Payer: Self-pay | Admitting: Family Medicine

## 2011-01-24 ENCOUNTER — Ambulatory Visit: Payer: Self-pay

## 2011-02-02 ENCOUNTER — Ambulatory Visit: Payer: Self-pay | Admitting: Family Medicine

## 2011-02-06 ENCOUNTER — Ambulatory Visit: Payer: Self-pay | Admitting: Family Medicine

## 2011-02-13 ENCOUNTER — Ambulatory Visit: Payer: Self-pay | Admitting: Family Medicine

## 2011-02-17 LAB — BASIC METABOLIC PANEL
BUN: 22
Calcium: 9
Chloride: 101
GFR calc non Af Amer: 48 — ABNORMAL LOW
Glucose, Bld: 120 — ABNORMAL HIGH
Potassium: 3.6
Sodium: 134 — ABNORMAL LOW

## 2011-02-17 LAB — POCT CARDIAC MARKERS
CKMB, poc: 3
Myoglobin, poc: 294
Operator id: 4001
Troponin i, poc: <0.05

## 2011-02-17 LAB — URINALYSIS, ROUTINE W REFLEX MICROSCOPIC
Bilirubin Urine: NEGATIVE
Glucose, UA: NEGATIVE
Hgb urine dipstick: NEGATIVE
Protein, ur: NEGATIVE
pH: 6

## 2011-02-17 LAB — DIFFERENTIAL
Basophils Absolute: 0
Basophils Relative: 0
Lymphocytes Relative: 12
Monocytes Relative: 7
Neutro Abs: 11.3 — ABNORMAL HIGH

## 2011-02-17 LAB — CBC
MCV: 89.2
RDW: 13.2
WBC: 14.2 — ABNORMAL HIGH

## 2011-05-02 ENCOUNTER — Other Ambulatory Visit: Payer: Self-pay | Admitting: Family Medicine

## 2011-06-06 ENCOUNTER — Other Ambulatory Visit: Payer: Self-pay | Admitting: Family Medicine

## 2011-06-07 ENCOUNTER — Ambulatory Visit: Payer: Self-pay

## 2011-06-09 ENCOUNTER — Ambulatory Visit: Payer: Self-pay

## 2011-06-16 ENCOUNTER — Ambulatory Visit: Payer: Self-pay | Admitting: Family Medicine

## 2011-06-29 ENCOUNTER — Other Ambulatory Visit: Payer: Self-pay | Admitting: Family Medicine

## 2011-08-16 ENCOUNTER — Other Ambulatory Visit: Payer: Self-pay | Admitting: Family Medicine

## 2011-08-16 ENCOUNTER — Telehealth: Payer: Self-pay | Admitting: *Deleted

## 2011-08-16 NOTE — Telephone Encounter (Signed)
Received faxed refill request from [facility] for Fluticasone 50 mcg.  It is not on her medication list.  Will forward to Dr. Shawnie Pons for refill authorization. Ileana Ladd

## 2011-08-17 MED ORDER — FLUCONAZOLE 150 MG PO TABS: 150.0000 mg | ORAL_TABLET | Freq: Every day | ORAL | Status: DC

## 2011-08-17 MED ORDER — FLUTICASONE PROPIONATE 50 MCG/ACT NA SUSP: 2.0000 | Freq: Every day | NASAL | Status: DC

## 2011-08-17 NOTE — Telephone Encounter (Signed)
LMOVM for pt to callback.  Please make sure she was talking about the nose spray for her allergies.  Also inform her if the she goes to pick up the med and they give her something called diflucan that it was a mistake made on our part, she does not need it. Garnet Overfield, Maryjo Rochester

## 2011-08-17 NOTE — Telephone Encounter (Signed)
It is talking about fluticasone, not fluconazone.  Will forward back to MD. Milas Gain, Maryjo Rochester

## 2011-09-11 ENCOUNTER — Other Ambulatory Visit: Payer: Self-pay | Admitting: Family Medicine

## 2011-10-14 ENCOUNTER — Other Ambulatory Visit: Payer: Self-pay | Admitting: Family Medicine

## 2011-10-30 ENCOUNTER — Ambulatory Visit: Payer: Self-pay | Admitting: Family Medicine

## 2012-01-01 ENCOUNTER — Ambulatory Visit (INDEPENDENT_AMBULATORY_CARE_PROVIDER_SITE_OTHER): Payer: Medicare Other | Admitting: Family Medicine

## 2012-01-01 ENCOUNTER — Encounter: Payer: Self-pay | Admitting: Family Medicine

## 2012-01-01 VITALS — BP 129/76 | HR 90 | Temp 98.2°F

## 2012-01-01 DIAGNOSIS — E669 Obesity, unspecified: Secondary | ICD-10-CM

## 2012-01-01 DIAGNOSIS — M1710 Unilateral primary osteoarthritis, unspecified knee: Secondary | ICD-10-CM

## 2012-01-01 DIAGNOSIS — Z111 Encounter for screening for respiratory tuberculosis: Secondary | ICD-10-CM

## 2012-01-01 MED ORDER — TUBERCULIN PPD 5 UNIT/0.1ML ID SOLN: 5.0000 [IU] | Freq: Once | INTRADERMAL | Status: DC

## 2012-01-01 NOTE — Progress Notes (Signed)
Subjective:  Same Day Appointment to discuss knee Pain   1. Osteoarthritis with knee pain-patient experiencing pain in both knees for 5 years. Right > left. Worse with damp weather or walking. Improved with rest but patient also still has pain even before going to sleep but never wakes from sleep.  She takes Diclofenac 75 mg once a day only in the morning because she states she is afraid of side effects even though it is ordered as BID prn. Does take Tylenol 1g in AM only as well. Most troublesome pain right now is in knees at night.   2. Obesity-patient readily admits when talking about OA that she needs to lose weight. She knows several ways she could do this but wants specific advise. Patient refused to be weighed today. Interested in seeing nutritionist or lifestyle coach. Saw Dr. Gerilyn Pilgrim several years ago but states she wants a very specific diet outlined-told exactly what she can and cannot eat. Attempted to educate patient but she does not want to revisit nutrition at this time but would prefer health coach. Knees hurt extremely with walking. Afraid of water for water aerobics but interested in it-unable to locate Arlys John today to look for water aerobics resources but patient plans meeting with her.   ROS--See HPI  Past Medical History-smoking status noted: never smoker.  Reviewed problem list.  Medications- reviewed and updated Chief complaint-noted  Objective:  Gen: NAD, morbidly obese CV: RRR no mrg Lungs: CTAB MSK: 1+ edema, bilateral knees with crepitus with minimal movement. Extension limited of bilateral knees by 10-20 degrees.  Skin: warm, and dry Gait: antalgic and walks with cane.   Assessment/Plan: See problem oriented charted Filled out handicap parking.

## 2012-01-01 NOTE — Patient Instructions (Signed)
Dear Sarah [facility],   It was great to see you today. Thank you for coming to clinic. Please read below regarding the issues that we discussed.   1. For your knee pain, you have set a goal to begin the process of losing weight. You are going to see our health coach Arlys John (get an appointment at the front). You also set a goal to eat 3 meals per day.  2. I would like you to take Tylenol at night since your knee pain seems to bother you with getting to sleep.   Please follow up in clinic in 4 weeks to see Dr. Shawnie Pons. Please call earlier if you have any questions or concerns.   Sincerely,  Dr. Tana Conch   My 5 to Fitness!  5: fruits and vegetables per day (work on 9 per day if you are at 5) 4: exercise 4-5 times per week for at least 30 minutes (walking counts!) 3: meals per day (don't skip breakfast!) 2: habits to quit -smoking -excess alcohol use (men >2 beer/day; women >1beer/day) 1: sweet per day (2 cookies, 1 small cup of ice cream, 12 oz soda)  These are general tips for healthy living. Try to start with 1 or 2 habit TODAY and make it a part of your life for several months. You set a goal today to work on: Eat 3 meals per day.   Once you have 1 or 2 habits down for several months, try to begin working on your next healthy habit. With every single step you take, you will be leading a healthier lifestyle!

## 2012-01-01 NOTE — Assessment & Plan Note (Signed)
Offered tramadol but patient states does not work for her. Since nighttime is most troublesome right now, will try tylenol in PM. Patient with goals of weight loss to help arthritis. TO meet with health coach for more specific goals-otherwise see obesity.   Patient to make follow up with Dr. Shawnie Pons or myself for follow up including labs as has not been seen in over 2 years.

## 2012-01-01 NOTE — Assessment & Plan Note (Signed)
Patient willing to make specific commitment to not skipping lunch but not ready for other goals at this time. Given handout on options and patient is to review. Have referred to Arlys John.

## 2012-01-04 ENCOUNTER — Ambulatory Visit: Payer: Medicare Other | Admitting: *Deleted

## 2012-01-04 DIAGNOSIS — IMO0001 Reserved for inherently not codable concepts without codable children: Secondary | ICD-10-CM

## 2012-01-04 LAB — TB SKIN TEST: TB Skin Test: NEGATIVE

## 2012-01-04 NOTE — Addendum Note (Signed)
Addended by: Garen Grams F on: 01/04/2012 12:26 PM   Modules accepted: Orders

## 2012-01-15 ENCOUNTER — Ambulatory Visit: Payer: BC Managed Care – PPO | Admitting: Home Health Services

## 2012-01-16 ENCOUNTER — Other Ambulatory Visit: Payer: Self-pay | Admitting: Family Medicine

## 2012-01-22 ENCOUNTER — Ambulatory Visit: Payer: BC Managed Care – PPO | Admitting: Home Health Services

## 2012-02-20 ENCOUNTER — Other Ambulatory Visit: Payer: Self-pay | Admitting: Family Medicine

## 2012-05-03 ENCOUNTER — Other Ambulatory Visit: Payer: Self-pay | Admitting: Family Medicine

## 2012-06-25 ENCOUNTER — Other Ambulatory Visit: Payer: Self-pay | Admitting: Family Medicine

## 2012-09-16 ENCOUNTER — Other Ambulatory Visit: Payer: Self-pay | Admitting: *Deleted

## 2012-09-16 NOTE — Telephone Encounter (Signed)
Also received refill request for Fluticasone Prop 50 mcg spray, but is not on medication list.   Will forward to Dr. Shawnie Pons to authorize refills.  Ileana Ladd

## 2012-09-17 MED ORDER — DICLOFENAC SODIUM 75 MG PO TBEC: 75.0000 mg | DELAYED_RELEASE_TABLET | Freq: Two times a day (BID) | ORAL | Status: DC

## 2012-09-26 ENCOUNTER — Other Ambulatory Visit: Payer: Self-pay | Admitting: Family Medicine

## 2012-10-04 ENCOUNTER — Ambulatory Visit: Payer: BC Managed Care – PPO | Admitting: Family Medicine

## 2012-10-16 ENCOUNTER — Other Ambulatory Visit: Payer: Self-pay | Admitting: Family Medicine

## 2012-10-18 ENCOUNTER — Ambulatory Visit: Payer: BC Managed Care – PPO | Admitting: Family Medicine

## 2012-11-06 ENCOUNTER — Ambulatory Visit: Payer: BC Managed Care – PPO | Admitting: Family Medicine

## 2013-02-11 ENCOUNTER — Other Ambulatory Visit: Payer: Self-pay | Admitting: Family Medicine

## 2013-03-28 ENCOUNTER — Other Ambulatory Visit: Payer: Self-pay | Admitting: Family Medicine

## 2013-04-23 ENCOUNTER — Other Ambulatory Visit: Payer: Self-pay | Admitting: Family Medicine

## 2013-06-17 ENCOUNTER — Other Ambulatory Visit: Payer: Self-pay | Admitting: Family Medicine

## 2013-10-18 ENCOUNTER — Other Ambulatory Visit: Payer: Self-pay | Admitting: Family Medicine

## 2014-01-05 ENCOUNTER — Other Ambulatory Visit: Payer: Self-pay | Admitting: Family Medicine

## 2014-02-15 ENCOUNTER — Other Ambulatory Visit: Payer: Self-pay | Admitting: Family Medicine

## 2014-06-20 ENCOUNTER — Other Ambulatory Visit: Payer: Self-pay | Admitting: Family Medicine

## 2014-07-24 ENCOUNTER — Other Ambulatory Visit: Payer: Self-pay | Admitting: Family Medicine

## 2014-08-08 ENCOUNTER — Other Ambulatory Visit: Payer: Self-pay | Admitting: Family Medicine

## 2014-10-19 ENCOUNTER — Other Ambulatory Visit: Payer: Self-pay | Admitting: Family Medicine

## 2015-02-23 ENCOUNTER — Other Ambulatory Visit: Payer: Self-pay | Admitting: Family Medicine

## 2015-02-28 ENCOUNTER — Other Ambulatory Visit: Payer: Self-pay | Admitting: Family Medicine

## 2015-06-25 ENCOUNTER — Other Ambulatory Visit: Payer: Self-pay | Admitting: Family Medicine

## 2015-07-05 ENCOUNTER — Ambulatory Visit (INDEPENDENT_AMBULATORY_CARE_PROVIDER_SITE_OTHER): Payer: Medicare Other | Admitting: Family Medicine

## 2015-07-05 ENCOUNTER — Encounter: Payer: Self-pay | Admitting: Family Medicine

## 2015-07-05 VITALS — BP 150/76 | HR 95 | Ht 64.0 in | Wt 204.0 lb

## 2015-07-05 DIAGNOSIS — IMO0002 Reserved for concepts with insufficient information to code with codable children: Secondary | ICD-10-CM

## 2015-07-05 DIAGNOSIS — R5382 Chronic fatigue, unspecified: Secondary | ICD-10-CM

## 2015-07-05 DIAGNOSIS — Z23 Encounter for immunization: Secondary | ICD-10-CM

## 2015-07-05 DIAGNOSIS — N950 Postmenopausal bleeding: Secondary | ICD-10-CM

## 2015-07-05 DIAGNOSIS — M179 Osteoarthritis of knee, unspecified: Secondary | ICD-10-CM

## 2015-07-05 DIAGNOSIS — I10 Essential (primary) hypertension: Secondary | ICD-10-CM

## 2015-07-05 DIAGNOSIS — M1710 Unilateral primary osteoarthritis, unspecified knee: Secondary | ICD-10-CM

## 2015-07-05 DIAGNOSIS — Z0000 Encounter for general adult medical examination without abnormal findings: Secondary | ICD-10-CM

## 2015-07-05 MED ORDER — PNEUMOCOCCAL 13-VAL CONJ VACC IM SUSP: 0.5000 mL | INTRAMUSCULAR | Status: DC

## 2015-07-05 MED ORDER — LISINOPRIL-HYDROCHLOROTHIAZIDE 20-25 MG PO TABS: 1.0000 | ORAL_TABLET | Freq: Every day | ORAL | Status: DC

## 2015-07-05 NOTE — Patient Instructions (Signed)
Preventive Care for Adults, Female A healthy lifestyle and preventive care can promote health and wellness. Preventive health guidelines for women include the following key practices.  A routine yearly physical is a good way to check with your health care provider about your health and preventive screening. It is a chance to share any concerns and updates on your health and to receive a thorough exam.  Visit your dentist for a routine exam and preventive care every 6 months. Brush your teeth twice a day and floss once a day. Good oral hygiene prevents tooth decay and gum disease.  The frequency of eye exams is based on your age, health, family medical history, use of contact lenses, and other factors. Follow your health care provider's recommendations for frequency of eye exams.  Eat a healthy diet. Foods like vegetables, fruits, whole grains, low-fat dairy products, and lean protein foods contain the nutrients you need without too many calories. Decrease your intake of foods high in solid fats, added sugars, and salt. Eat the right amount of calories for you.Get information about a proper diet from your health care provider, if necessary.  Regular physical exercise is one of the most important things you can do for your health. Most adults should get at least 150 minutes of moderate-intensity exercise (any activity that increases your heart rate and causes you to sweat) each week. In addition, most adults need muscle-strengthening exercises on 2 or more days a week.  Maintain a healthy weight. The body mass index (BMI) is a screening tool to identify possible weight problems. It provides an estimate of body fat based on height and weight. Your health care provider can find your BMI and can help you achieve or maintain a healthy weight.For adults 20 years and older:  A BMI below 18.5 is considered underweight.  A BMI of 18.5 to 24.9 is normal.  A BMI of 25 to 29.9 is considered overweight.  A  BMI of 30 and above is considered obese.  Maintain normal blood lipids and cholesterol levels by exercising and minimizing your intake of saturated fat. Eat a balanced diet with plenty of fruit and vegetables. Blood tests for lipids and cholesterol should begin at age 58 and be repeated every 5 years. If your lipid or cholesterol levels are high, you are over 50, or you are at high risk for heart disease, you may need your cholesterol levels checked more frequently.Ongoing high lipid and cholesterol levels should be treated with medicines if diet and exercise are not working.  If you smoke, find out from your health care provider how to quit. If you do not use tobacco, do not start.  Lung cancer screening is recommended for adults aged 41-80 years who are at high risk for developing lung cancer because of a history of smoking. A yearly low-dose CT scan of the lungs is recommended for people who have at least a 30-pack-year history of smoking and are a current smoker or have quit within the past 15 years. A pack year of smoking is smoking an average of 1 pack of cigarettes a day for 1 year (for example: 1 pack a day for 30 years or 2 packs a day for 15 years). Yearly screening should continue until the smoker has stopped smoking for at least 15 years. Yearly screening should be stopped for people who develop a health problem that would prevent them from having lung cancer treatment.  If you are pregnant, do not drink alcohol. If you are  breastfeeding, be very cautious about drinking alcohol. If you are not pregnant and choose to drink alcohol, do not have more than 1 drink per day. One drink is considered to be 12 ounces (355 mL) of beer, 5 ounces (148 mL) of wine, or 1.5 ounces (44 mL) of liquor.  Avoid use of street drugs. Do not share needles with anyone. Ask for help if you need support or instructions about stopping the use of drugs.  High blood pressure causes heart disease and increases the risk  of stroke. Your blood pressure should be checked at least every 1 to 2 years. Ongoing high blood pressure should be treated with medicines if weight loss and exercise do not work.  If you are 78-26 years old, ask your health care provider if you should take aspirin to prevent strokes.  Diabetes screening is done by taking a blood sample to check your blood glucose level after you have not eaten for a certain period of time (fasting). If you are not overweight and you do not have risk factors for diabetes, you should be screened once every 3 years starting at age 70. If you are overweight or obese and you are 28-38 years of age, you should be screened for diabetes every year as part of your cardiovascular risk assessment.  Breast cancer screening is essential preventive care for women. You should practice "breast self-awareness." This means understanding the normal appearance and feel of your breasts and may include breast self-examination. Any changes detected, no matter how small, should be reported to a health care provider. Women in their 10s and 30s should have a clinical breast exam (CBE) by a health care provider as part of a regular health exam every 1 to 3 years. After age 70, women should have a CBE every year. Starting at age 2, women should consider having a mammogram (breast X-ray test) every year. Women who have a family history of breast cancer should talk to their health care provider about genetic screening. Women at a high risk of breast cancer should talk to their health care providers about having an MRI and a mammogram every year.  Breast cancer gene (BRCA)-related cancer risk assessment is recommended for women who have family members with BRCA-related cancers. BRCA-related cancers include breast, ovarian, tubal, and peritoneal cancers. Having family members with these cancers may be associated with an increased risk for harmful changes (mutations) in the breast cancer genes BRCA1 and  BRCA2. Results of the assessment will determine the need for genetic counseling and BRCA1 and BRCA2 testing.  Your health care provider may recommend that you be screened regularly for cancer of the pelvic organs (ovaries, uterus, and vagina). This screening involves a pelvic examination, including checking for microscopic changes to the surface of your cervix (Pap test). You may be encouraged to have this screening done every 3 years, beginning at age 32.  For women ages 66-65, health care providers may recommend pelvic exams and Pap testing every 3 years, or they may recommend the Pap and pelvic exam, combined with testing for human papilloma virus (HPV), every 5 years. Some types of HPV increase your risk of cervical cancer. Testing for HPV may also be done on women of any age with unclear Pap test results.  Other health care providers may not recommend any screening for nonpregnant women who are considered low risk for pelvic cancer and who do not have symptoms. Ask your health care provider if a screening pelvic exam is right for  you.  If you have had past treatment for cervical cancer or a condition that could lead to cancer, you need Pap tests and screening for cancer for at least 20 years after your treatment. If Pap tests have been discontinued, your risk factors (such as having a new sexual partner) need to be reassessed to determine if screening should resume. Some women have medical problems that increase the chance of getting cervical cancer. In these cases, your health care provider may recommend more frequent screening and Pap tests.  Colorectal cancer can be detected and often prevented. Most routine colorectal cancer screening begins at the age of 50 years and continues through age 75 years. However, your health care provider may recommend screening at an earlier age if you have risk factors for colon cancer. On a yearly basis, your health care provider may provide home test kits to check  for hidden blood in the stool. Use of a small camera at the end of a tube, to directly examine the colon (sigmoidoscopy or colonoscopy), can detect the earliest forms of colorectal cancer. Talk to your health care provider about this at age 50, when routine screening begins. Direct exam of the colon should be repeated every 5-10 years through age 75 years, unless early forms of precancerous polyps or small growths are found.  People who are at an increased risk for hepatitis B should be screened for this virus. You are considered at high risk for hepatitis B if:  You were born in a country where hepatitis B occurs often. Talk with your health care provider about which countries are considered high risk.  Your parents were born in a high-risk country and you have not received a shot to protect against hepatitis B (hepatitis B vaccine).  You have HIV or AIDS.  You use needles to inject street drugs.  You live with, or have sex with, someone who has hepatitis B.  You get hemodialysis treatment.  You take certain medicines for conditions like cancer, organ transplantation, and autoimmune conditions.  Hepatitis C blood testing is recommended for all people born from 1945 through 1965 and any individual with known risks for hepatitis C.  Practice safe sex. Use condoms and avoid high-risk sexual practices to reduce the spread of sexually transmitted infections (STIs). STIs include gonorrhea, chlamydia, syphilis, trichomonas, herpes, HPV, and human immunodeficiency virus (HIV). Herpes, HIV, and HPV are viral illnesses that have no cure. They can result in disability, cancer, and death.  You should be screened for sexually transmitted illnesses (STIs) including gonorrhea and chlamydia if:  You are sexually active and are younger than 24 years.  You are older than 24 years and your health care provider tells you that you are at risk for this type of infection.  Your sexual activity has changed  since you were last screened and you are at an increased risk for chlamydia or gonorrhea. Ask your health care provider if you are at risk.  If you are at risk of being infected with HIV, it is recommended that you take a prescription medicine daily to prevent HIV infection. This is called preexposure prophylaxis (PrEP). You are considered at risk if:  You are sexually active and do not regularly use condoms or know the HIV status of your partner(s).  You take drugs by injection.  You are sexually active with a partner who has HIV.  Talk with your health care provider about whether you are at high risk of being infected with HIV. If   you choose to begin PrEP, you should first be tested for HIV. You should then be tested every 3 months for as long as you are taking PrEP.  Osteoporosis is a disease in which the bones lose minerals and strength with aging. This can result in serious bone fractures or breaks. The risk of osteoporosis can be identified using a bone density scan. Women ages 67 years and over and women at risk for fractures or osteoporosis should discuss screening with their health care providers. Ask your health care provider whether you should take a calcium supplement or vitamin D to reduce the rate of osteoporosis.  Menopause can be associated with physical symptoms and risks. Hormone replacement therapy is available to decrease symptoms and risks. You should talk to your health care provider about whether hormone replacement therapy is right for you.  Use sunscreen. Apply sunscreen liberally and repeatedly throughout the day. You should seek shade when your shadow is shorter than you. Protect yourself by wearing long sleeves, pants, a wide-brimmed hat, and sunglasses year round, whenever you are outdoors.  Once a month, do a whole body skin exam, using a mirror to look at the skin on your back. Tell your health care provider of new moles, moles that have irregular borders, moles that  are larger than a pencil eraser, or moles that have changed in shape or color.  Stay current with required vaccines (immunizations).  Influenza vaccine. All adults should be immunized every year.  Tetanus, diphtheria, and acellular pertussis (Td, Tdap) vaccine. Pregnant women should receive 1 dose of Tdap vaccine during each pregnancy. The dose should be obtained regardless of the length of time since the last dose. Immunization is preferred during the 27th-36th week of gestation. An adult who has not previously received Tdap or who does not know her vaccine status should receive 1 dose of Tdap. This initial dose should be followed by tetanus and diphtheria toxoids (Td) booster doses every 10 years. Adults with an unknown or incomplete history of completing a 3-dose immunization series with Td-containing vaccines should begin or complete a primary immunization series including a Tdap dose. Adults should receive a Td booster every 10 years.  Varicella vaccine. An adult without evidence of immunity to varicella should receive 2 doses or a second dose if she has previously received 1 dose. Pregnant females who do not have evidence of immunity should receive the first dose after pregnancy. This first dose should be obtained before leaving the health care facility. The second dose should be obtained 4-8 weeks after the first dose.  Human papillomavirus (HPV) vaccine. Females aged 13-26 years who have not received the vaccine previously should obtain the 3-dose series. The vaccine is not recommended for use in pregnant females. However, pregnancy testing is not needed before receiving a dose. If a female is found to be pregnant after receiving a dose, no treatment is needed. In that case, the remaining doses should be delayed until after the pregnancy. Immunization is recommended for any person with an immunocompromised condition through the age of 61 years if she did not get any or all doses earlier. During the  3-dose series, the second dose should be obtained 4-8 weeks after the first dose. The third dose should be obtained 24 weeks after the first dose and 16 weeks after the second dose.  Zoster vaccine. One dose is recommended for adults aged 30 years or older unless certain conditions are present.  Measles, mumps, and rubella (MMR) vaccine. Adults born  before 1957 generally are considered immune to measles and mumps. Adults born in 1957 or later should have 1 or more doses of MMR vaccine unless there is a contraindication to the vaccine or there is laboratory evidence of immunity to each of the three diseases. A routine second dose of MMR vaccine should be obtained at least 28 days after the first dose for students attending postsecondary schools, health care workers, or international travelers. People who received inactivated measles vaccine or an unknown type of measles vaccine during 1963-1967 should receive 2 doses of MMR vaccine. People who received inactivated mumps vaccine or an unknown type of mumps vaccine before 1979 and are at high risk for mumps infection should consider immunization with 2 doses of MMR vaccine. For females of childbearing age, rubella immunity should be determined. If there is no evidence of immunity, females who are not pregnant should be vaccinated. If there is no evidence of immunity, females who are pregnant should delay immunization until after pregnancy. Unvaccinated health care workers born before 1957 who lack laboratory evidence of measles, mumps, or rubella immunity or laboratory confirmation of disease should consider measles and mumps immunization with 2 doses of MMR vaccine or rubella immunization with 1 dose of MMR vaccine.  Pneumococcal 13-valent conjugate (PCV13) vaccine. When indicated, a person who is uncertain of his immunization history and has no record of immunization should receive the PCV13 vaccine. All adults 65 years of age and older should receive this  vaccine. An adult aged 19 years or older who has certain medical conditions and has not been previously immunized should receive 1 dose of PCV13 vaccine. This PCV13 should be followed with a dose of pneumococcal polysaccharide (PPSV23) vaccine. Adults who are at high risk for pneumococcal disease should obtain the PPSV23 vaccine at least 8 weeks after the dose of PCV13 vaccine. Adults older than 72 years of age who have normal immune system function should obtain the PPSV23 vaccine dose at least 1 year after the dose of PCV13 vaccine.  Pneumococcal polysaccharide (PPSV23) vaccine. When PCV13 is also indicated, PCV13 should be obtained first. All adults aged 65 years and older should be immunized. An adult younger than age 65 years who has certain medical conditions should be immunized. Any person who resides in a nursing home or long-term care facility should be immunized. An adult smoker should be immunized. People with an immunocompromised condition and certain other conditions should receive both PCV13 and PPSV23 vaccines. People with human immunodeficiency virus (HIV) infection should be immunized as soon as possible after diagnosis. Immunization during chemotherapy or radiation therapy should be avoided. Routine use of PPSV23 vaccine is not recommended for American Indians, [place] Natives, or people younger than 65 years unless there are medical conditions that require PPSV23 vaccine. When indicated, people who have unknown immunization and have no record of immunization should receive PPSV23 vaccine. One-time revaccination 5 years after the first dose of PPSV23 is recommended for people aged 19-64 years who have chronic kidney failure, nephrotic syndrome, asplenia, or immunocompromised conditions. People who received 1-2 doses of PPSV23 before age 65 years should receive another dose of PPSV23 vaccine at age 65 years or later if at least 5 years have passed since the previous dose. Doses of PPSV23 are not  needed for people immunized with PPSV23 at or after age 65 years.  Meningococcal vaccine. Adults with asplenia or persistent complement component deficiencies should receive 2 doses of quadrivalent meningococcal conjugate (MenACWY-D) vaccine. The doses should be obtained   at least 2 months apart. Microbiologists working with certain meningococcal bacteria, [place] recruits, people at risk during an outbreak, and people who travel to or live in countries with a high rate of meningitis should be immunized. A first-year college student up through age 34 years who is living in a residence hall should receive a dose if she did not receive a dose on or after her 16th birthday. Adults who have certain high-risk conditions should receive one or more doses of vaccine.  Hepatitis A vaccine. Adults who wish to be protected from this disease, have certain high-risk conditions, work with hepatitis A-infected animals, work in hepatitis A research labs, or travel to or work in countries with a high rate of hepatitis A should be immunized. Adults who were previously unvaccinated and who anticipate close contact with an international adoptee during the first 60 days after arrival in the [place] from a country with a high rate of hepatitis A should be immunized.  Hepatitis B vaccine. Adults who wish to be protected from this disease, have certain high-risk conditions, may be exposed to blood or other infectious body fluids, are household contacts or sex partners of hepatitis B positive people, are clients or workers in certain care facilities, or travel to or work in countries with a high rate of hepatitis B should be immunized.  Haemophilus influenzae type b (Hib) vaccine. A previously unvaccinated person with asplenia or sickle cell disease or having a scheduled splenectomy should receive 1 dose of Hib vaccine. Regardless of previous immunization, a recipient of a hematopoietic stem cell transplant should receive a  3-dose series 6-12 months after her successful transplant. Hib vaccine is not recommended for adults with HIV infection. Preventive Services / Frequency Ages 35 to 4 years  Blood pressure check.** / Every 3-5 years.  Lipid and cholesterol check.** / Every 5 years beginning at age 60.  Clinical breast exam.** / Every 3 years for women in their 71s and 10s.  BRCA-related cancer risk assessment.** / For women who have family members with a BRCA-related cancer (breast, ovarian, tubal, or peritoneal cancers).  Pap test.** / Every 2 years from ages 76 through 26. Every 3 years starting at age 61 through age 76 or 93 with a history of 3 consecutive normal Pap tests.  HPV screening.** / Every 3 years from ages 37 through ages 60 to 51 with a history of 3 consecutive normal Pap tests.  Hepatitis C blood test.** / For any individual with known risks for hepatitis C.  Skin self-exam. / Monthly.  Influenza vaccine. / Every year.  Tetanus, diphtheria, and acellular pertussis (Tdap, Td) vaccine.** / Consult your health care provider. Pregnant women should receive 1 dose of Tdap vaccine during each pregnancy. 1 dose of Td every 10 years.  Varicella vaccine.** / Consult your health care provider. Pregnant females who do not have evidence of immunity should receive the first dose after pregnancy.  HPV vaccine. / 3 doses over 6 months, if 93 and younger. The vaccine is not recommended for use in pregnant females. However, pregnancy testing is not needed before receiving a dose.  Measles, mumps, rubella (MMR) vaccine.** / You need at least 1 dose of MMR if you were born in 1957 or later. You may also need a 2nd dose. For females of childbearing age, rubella immunity should be determined. If there is no evidence of immunity, females who are not pregnant should be vaccinated. If there is no evidence of immunity, females who are  pregnant should delay immunization until after pregnancy.  Pneumococcal  13-valent conjugate (PCV13) vaccine.** / Consult your health care provider.  Pneumococcal polysaccharide (PPSV23) vaccine.** / 1 to 2 doses if you smoke cigarettes or if you have certain conditions.  Meningococcal vaccine.** / 1 dose if you are age 49 to 59 years and a Market researcher living in a residence hall, or have one of several medical conditions, you need to get vaccinated against meningococcal disease. You may also need additional booster doses.  Hepatitis A vaccine.** / Consult your health care provider.  Hepatitis B vaccine.** / Consult your health care provider.  Haemophilus influenzae type b (Hib) vaccine.** / Consult your health care provider. Ages 10 to 82 years  Blood pressure check.** / Every year.  Lipid and cholesterol check.** / Every 5 years beginning at age 70 years.  Lung cancer screening. / Every year if you are aged 20-80 years and have a 30-pack-year history of smoking and currently smoke or have quit within the past 15 years. Yearly screening is stopped once you have quit smoking for at least 15 years or develop a health problem that would prevent you from having lung cancer treatment.  Clinical breast exam.** / Every year after age 25 years.  BRCA-related cancer risk assessment.** / For women who have family members with a BRCA-related cancer (breast, ovarian, tubal, or peritoneal cancers).  Mammogram.** / Every year beginning at age 18 years and continuing for as long as you are in good health. Consult with your health care provider.  Pap test.** / Every 3 years starting at age 72 years through age 43 or 42 years with a history of 3 consecutive normal Pap tests.  HPV screening.** / Every 3 years from ages 45 years through ages 68 to 65 years with a history of 3 consecutive normal Pap tests.  Fecal occult blood test (FOBT) of stool. / Every year beginning at age 48 years and continuing until age 48 years. You may not need to do this test if you get  a colonoscopy every 10 years.  Flexible sigmoidoscopy or colonoscopy.** / Every 5 years for a flexible sigmoidoscopy or every 10 years for a colonoscopy beginning at age 65 years and continuing until age 89 years.  Hepatitis C blood test.** / For all people born from [birth year] through 1967 and any individual with known risks for hepatitis C.  Skin self-exam. / Monthly.  Influenza vaccine. / Every year.  Tetanus, diphtheria, and acellular pertussis (Tdap/Td) vaccine.** / Consult your health care provider. Pregnant women should receive 1 dose of Tdap vaccine during each pregnancy. 1 dose of Td every 10 years.  Varicella vaccine.** / Consult your health care provider. Pregnant females who do not have evidence of immunity should receive the first dose after pregnancy.  Zoster vaccine.** / 1 dose for adults aged 1 years or older.  Measles, mumps, rubella (MMR) vaccine.** / You need at least 1 dose of MMR if you were born in 1957 or later. You may also need a second dose. For females of childbearing age, rubella immunity should be determined. If there is no evidence of immunity, females who are not pregnant should be vaccinated. If there is no evidence of immunity, females who are pregnant should delay immunization until after pregnancy.  Pneumococcal 13-valent conjugate (PCV13) vaccine.** / Consult your health care provider.  Pneumococcal polysaccharide (PPSV23) vaccine.** / 1 to 2 doses if you smoke cigarettes or if you have certain conditions.  Meningococcal vaccine.** /  Consult your health care provider.  Hepatitis A vaccine.** / Consult your health care provider.  Hepatitis B vaccine.** / Consult your health care provider.  Haemophilus influenzae type b (Hib) vaccine.** / Consult your health care provider. Ages 64 years and over  Blood pressure check.** / Every year.  Lipid and cholesterol check.** / Every 5 years beginning at age 23 years.  Lung cancer screening. / Every year if you  are aged 16-80 years and have a 30-pack-year history of smoking and currently smoke or have quit within the past 15 years. Yearly screening is stopped once you have quit smoking for at least 15 years or develop a health problem that would prevent you from having lung cancer treatment.  Clinical breast exam.** / Every year after age 74 years.  BRCA-related cancer risk assessment.** / For women who have family members with a BRCA-related cancer (breast, ovarian, tubal, or peritoneal cancers).  Mammogram.** / Every year beginning at age 44 years and continuing for as long as you are in good health. Consult with your health care provider.  Pap test.** / Every 3 years starting at age 58 years through age 22 or 39 years with 3 consecutive normal Pap tests. Testing can be stopped between 65 and 70 years with 3 consecutive normal Pap tests and no abnormal Pap or HPV tests in the past 10 years.  HPV screening.** / Every 3 years from ages 64 years through ages 70 or 61 years with a history of 3 consecutive normal Pap tests. Testing can be stopped between 65 and 70 years with 3 consecutive normal Pap tests and no abnormal Pap or HPV tests in the past 10 years.  Fecal occult blood test (FOBT) of stool. / Every year beginning at age 40 years and continuing until age 27 years. You may not need to do this test if you get a colonoscopy every 10 years.  Flexible sigmoidoscopy or colonoscopy.** / Every 5 years for a flexible sigmoidoscopy or every 10 years for a colonoscopy beginning at age 7 years and continuing until age 32 years.  Hepatitis C blood test.** / For all people born from [birth year] through 1967 and any individual with known risks for hepatitis C.  Osteoporosis screening.** / A one-time screening for women ages 30 years and over and women at risk for fractures or osteoporosis.  Skin self-exam. / Monthly.  Influenza vaccine. / Every year.  Tetanus, diphtheria, and acellular pertussis (Tdap/Td)  vaccine.** / 1 dose of Td every 10 years.  Varicella vaccine.** / Consult your health care provider.  Zoster vaccine.** / 1 dose for adults aged 35 years or older.  Pneumococcal 13-valent conjugate (PCV13) vaccine.** / Consult your health care provider.  Pneumococcal polysaccharide (PPSV23) vaccine.** / 1 dose for all adults aged 46 years and older.  Meningococcal vaccine.** / Consult your health care provider.  Hepatitis A vaccine.** / Consult your health care provider.  Hepatitis B vaccine.** / Consult your health care provider.  Haemophilus influenzae type b (Hib) vaccine.** / Consult your health care provider. ** Family history and personal history of risk and conditions may change your health care provider's recommendations.   This information is not intended to replace advice given to you by your health care provider. Make sure you discuss any questions you have with your health care provider.   Document Released: 07/11/2001 Document Revised: 06/05/2014 Document Reviewed: 10/10/2010 Elsevier Interactive Patient Education [employer].

## 2015-07-05 NOTE — Progress Notes (Signed)
Subjective:    Patient ID: Sarah Moody is a 70 y.o. female presenting with Establish Care and Sciatica  on 07/05/2015  HPI: Here to re-establish care. Has pain in her abdomen peri-umbilical and into her lower back and side and hip and right leg pain. Notes pain is worse with sitting. Notes urinary frequency and sleeps a lot. Has early morning wakening. Feels like her mood is good.  Working in private care and doing well. Feels a lot of fatigue. She is worried.  Notes limited salt use and does not eat fried foods. Taking her BP meds as directed. Noted some spotting in her underpants a few weeks ago. None since. Menopause at age 59. Feels tired in her chest. Denies SOB, palpitations, DOE or exertional chest pain. Patient has not been in for a few years and is worried, that she will hear bad news.  Review of Systems  Constitutional: Negative for fever and chills.  Respiratory: Negative for shortness of breath.   Cardiovascular: Negative for chest pain.  Gastrointestinal: Positive for abdominal pain. Negative for nausea, vomiting, diarrhea, constipation and blood in stool.  Genitourinary: Negative for dysuria.  Skin: Negative for rash.      Objective:    BP 150/76 mmHg  Pulse 95  Ht 5\' 4"  (1.626 m)  Wt 204 lb (92.534 kg)  BMI 35.00 kg/m2 Physical Exam  Constitutional: She is oriented to person, place, and time. She appears well-developed and well-nourished. No distress.  HENT:  Head: Normocephalic and atraumatic.  Eyes: No scleral icterus.  Neck: Neck supple.  Cardiovascular: Normal rate and regular rhythm.   No murmur heard. Pulmonary/Chest: Effort normal and breath sounds normal.  Abdominal: Soft. She exhibits no mass. There is no tenderness.  Neurological: She is alert and oriented to person, place, and time.  Skin: Skin is warm and dry.  Psychiatric: She has a normal mood and affect.        Assessment & Plan:   Problem List Items Addressed This Visit      Unprioritized   HYPERTENSION, BENIGN SYSTEMIC    Switch to non-generic Zestoretic and check BP outside of office. If still elevated consider increasing dose. DASH diet.      Relevant Medications   lisinopril-hydrochlorothiazide (PRINZIDE,ZESTORETIC) 20-25 MG tablet   Osteoarthrosis involving lower leg    Considering SynVisc--would need ortho       Other Visit Diagnoses    Routine general medical examination at a health care facility    -  Primary    Too old for pap smear-other health maintenance update    Relevant Orders    MM DIGITAL SCREENING BILATERAL    CBC    Need for pneumococcal vaccination        Relevant Orders    Pneumococcal conjugate vaccine 13-valent IM (Completed)    Comprehensive metabolic panel    Lipid panel    Chronic fatigue        Check CBC, TSH and Vitamin D    Relevant Orders    Ambulatory referral to Gastroenterology    TSH    VITAMIN D 25 Hydroxy (Vit-D Deficiency, Fractures)    Postmenopausal bleeding        Check pelvic sono--if thickened lining, proceed with endometrial sampling.    Relevant Orders    US Pelvis Complete    US Transvaginal Non-OB        Return in about 6 weeks (around 08/16/2015) for a follow-up.  Sarah Moody 07/05/2015 4:41 PM

## 2015-07-06 NOTE — Assessment & Plan Note (Signed)
Considering SynVisc--would need ortho

## 2015-07-06 NOTE — Assessment & Plan Note (Signed)
Switch to non-generic Zestoretic and check BP outside of office. If still elevated consider increasing dose. DASH diet.

## 2015-07-07 ENCOUNTER — Other Ambulatory Visit: Payer: Medicare Other

## 2015-07-07 DIAGNOSIS — Z0000 Encounter for general adult medical examination without abnormal findings: Secondary | ICD-10-CM

## 2015-07-07 DIAGNOSIS — R5382 Chronic fatigue, unspecified: Secondary | ICD-10-CM

## 2015-07-07 DIAGNOSIS — Z23 Encounter for immunization: Secondary | ICD-10-CM

## 2015-07-07 LAB — COMPREHENSIVE METABOLIC PANEL
ALK PHOS: 124 U/L (ref 33–130)
ALT: 10 U/L (ref 6–29)
AST: 17 U/L (ref 10–35)
Albumin: 3.8 g/dL (ref 3.6–5.1)
BUN: 28 mg/dL — AB (ref 7–25)
CALCIUM: 8.9 mg/dL (ref 8.6–10.4)
CHLORIDE: 98 mmol/L (ref 98–110)
CO2: 25 mmol/L (ref 20–31)
Creat: 1.56 mg/dL — ABNORMAL HIGH (ref 0.60–0.93)
GLUCOSE: 80 mg/dL (ref 65–99)
POTASSIUM: 4.1 mmol/L (ref 3.5–5.3)
Sodium: 136 mmol/L (ref 135–146)
Total Bilirubin: 0.6 mg/dL (ref 0.2–1.2)
Total Protein: 7.3 g/dL (ref 6.1–8.1)

## 2015-07-07 LAB — LIPID PANEL
Cholesterol: 272 mg/dL — ABNORMAL HIGH (ref 125–200)
HDL: 62 mg/dL (ref >=46)
LDL CALC: 192 mg/dL — AB (ref <130)
TRIGLYCERIDES: 92 mg/dL (ref <150)
Total CHOL/HDL Ratio: 4.4 Ratio (ref <=5.0)
VLDL: 18 mg/dL (ref <30)

## 2015-07-07 LAB — CBC
HEMATOCRIT: 38.3 % (ref 36.0–46.0)
Hemoglobin: 12.6 g/dL (ref 12.0–15.0)
MCH: 30.7 pg (ref 26.0–34.0)
MCHC: 32.9 g/dL (ref 30.0–36.0)
MCV: 93.2 fL (ref 78.0–100.0)
MPV: 11.1 fL (ref 8.6–12.4)
PLATELETS: 317 10*3/uL (ref 150–400)
RBC: 4.11 MIL/uL (ref 3.87–5.11)
RDW: 13.3 % (ref 11.5–15.5)
WBC: 8 10*3/uL (ref 4.0–10.5)

## 2015-07-07 LAB — TSH: TSH: 0.84 mIU/L

## 2015-07-07 NOTE — Progress Notes (Signed)
Labs done today Sarah Moody 

## 2015-07-08 ENCOUNTER — Encounter: Payer: Self-pay | Admitting: Family Medicine

## 2015-07-12 ENCOUNTER — Telehealth: Payer: Self-pay | Admitting: *Deleted

## 2015-07-12 NOTE — Telephone Encounter (Signed)
Patient called stating she received a pneumonia shot last Monday.  Review the chart patient received Prevnar 13.  Patient stated her arm is still swollen, itching, red and warm to touch.  Advised patient that a nurse should take a look at her arm.  Informed her that she had a local reaction to the vaccine.  Patient to apply cold compress and that antihistamine for the itching.  Appointment made for tomorrow 07/13/15 at 11 AM for nurse to assess the reaction.  Will forward to PCP.  Derl Barrow, RN

## 2015-07-14 ENCOUNTER — Telehealth: Payer: Self-pay | Admitting: *Deleted

## 2015-07-14 ENCOUNTER — Ambulatory Visit (HOSPITAL_COMMUNITY): Payer: Medicare Other

## 2015-07-14 ENCOUNTER — Ambulatory Visit (INDEPENDENT_AMBULATORY_CARE_PROVIDER_SITE_OTHER): Payer: Medicare Other | Admitting: *Deleted

## 2015-07-14 DIAGNOSIS — T50Z95A Adverse effect of other vaccines and biological substances, initial encounter: Secondary | ICD-10-CM

## 2015-07-14 NOTE — Progress Notes (Signed)
   Patient in nurse clinic for evaluation of possible reaction to Prevnar -13.  Patient received the vaccine on 07/05/15 and developed severe swelling, redness, itching and warm to touch skin on 07/06/15.  Patient called clinic on 07/12/15 stating her left arm was still swollen, red and itching and wanted to know what to do.  Advised patient to come into clinic for the nurse to assess her arm. Until she could come into clinic apply cold compress and antihistamine cream for the itching.  Dr. Gwendlyn Deutscher assessed area today, no swelling, no swelling noted, denies fever, patient had scars from scratching.  Patient advised to use Benadryl cream for the itching.  Vaccine Adverse Event form completed and faxed.  Derl Barrow, RN

## 2015-07-14 NOTE — Telephone Encounter (Signed)
Patient is requesting an appointment to see a nutritionist.  She stated that her blood work was a little high and would like what foods she need to eat to help lower her cholesterol.  Please call patient's cell phone at [phone].  Derl Barrow, RN

## 2015-07-15 ENCOUNTER — Other Ambulatory Visit: Payer: Self-pay | Admitting: Family Medicine

## 2015-07-15 DIAGNOSIS — Z1231 Encounter for screening mammogram for malignant neoplasm of breast: Secondary | ICD-10-CM

## 2015-07-21 ENCOUNTER — Ambulatory Visit (HOSPITAL_COMMUNITY)
Admission: RE | Admit: 2015-07-21 | Discharge: 2015-07-21 | Disposition: A | Discharge status: Home / Self Care | Payer: Medicare Other | Source: Ambulatory Visit | Attending: Family Medicine | Admitting: Family Medicine

## 2015-07-21 DIAGNOSIS — N950 Postmenopausal bleeding: Secondary | ICD-10-CM | POA: Insufficient documentation

## 2015-07-21 DIAGNOSIS — Z8742 Personal history of other diseases of the female genital tract: Secondary | ICD-10-CM | POA: Insufficient documentation

## 2015-07-22 ENCOUNTER — Telehealth: Payer: Self-pay | Admitting: *Deleted

## 2015-07-22 NOTE — Telephone Encounter (Signed)
-----   Message from Donnamae Jude, MD sent at 07/21/2015  3:30 PM EST ----- Because she refused part of the exam, it is not complete and does not tell us a lot.  If she continues to have bleeding, we will need to do an office biopsy

## 2015-07-22 NOTE — Telephone Encounter (Signed)
LM for patient to call back. Jazmin Hartsell,CMA  

## 2015-07-29 ENCOUNTER — Ambulatory Visit (INDEPENDENT_AMBULATORY_CARE_PROVIDER_SITE_OTHER): Payer: Medicare Other | Admitting: Family Medicine

## 2015-07-29 VITALS — Ht 64.0 in | Wt 204.0 lb

## 2015-07-29 DIAGNOSIS — E785 Hyperlipidemia, unspecified: Secondary | ICD-10-CM

## 2015-07-29 DIAGNOSIS — E669 Obesity, unspecified: Secondary | ICD-10-CM

## 2015-07-29 DIAGNOSIS — E66812 Obesity, class 2: Secondary | ICD-10-CM

## 2015-07-29 NOTE — Progress Notes (Signed)
Patient's main concerm:  High cholesterol  Filed Weights   07/29/15 1144  Weight: 204 lb (92.534 kg)    Learning Readiness:   Contemplating initially. Ready by the end of encounter  Usual eating pattern includes 2 meals and 0 snacks per day. Frequent foods and beverages include Bread, orange juice, cranberry juice, water.  Usual physical activity includes taking care of her clients but nothing formal. 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 Orange juice (8oz), coffee (1/4cup of creamer and sweetener) D (4 PM): 1 cup of rice with carrots and broccoli with a pork chop (4oz) and mushroom gravy. Diet Sprite (4oz) and water. One piece of lemon pound cake Typical day? Yes.    She has had approximately 1200 calories  Handouts given during visit include:  AVS  Demonstrated degree of understanding via:  Teach Back  Barriers to learning/adherence to lifestyle change: Patient has osteoarthritis of her bilateral knees, making it difficult to perform weight bearing exercises  Monitoring/Evaluation:  Dietary intake, exercise, and body weight in 1 month(s).  Total time spent face to face with patient was 60 minutes, of which >50% was spent on counseling and discussion of nutrition and obesity.

## 2015-07-29 NOTE — Patient Instructions (Addendum)
Eat at least 3 REAL meals (and 1-2 snacks per day if you get hungry). Aim for no more than 5 hours between eating.  Eat breakfast within one hour of getting up.  Physical activity goal: Ride stationary bike at the [facility] at least 30 minutes, 3 times per week. [facility]. Also, work on getting out of the chair as a daily exercise.  Obtain twice as many veg's as protein or carbohydrate foods for both lunch and dinner.

## 2015-07-30 ENCOUNTER — Encounter: Payer: Self-pay | Admitting: Family Medicine

## 2015-08-10 ENCOUNTER — Telehealth: Payer: Self-pay | Admitting: Family Medicine

## 2015-08-10 NOTE — Telephone Encounter (Signed)
Spoke to Sarah Moody to tell her that Dr McDiarmid has said he will be happy to have her as a patient.  She will call to schedule with him.

## 2015-09-02 ENCOUNTER — Ambulatory Visit: Payer: Medicare Other | Admitting: Family Medicine

## 2015-11-15 ENCOUNTER — Other Ambulatory Visit: Payer: Self-pay | Admitting: Family Medicine

## 2015-11-28 ENCOUNTER — Other Ambulatory Visit: Payer: Self-pay | Admitting: Family Medicine

## 2016-03-16 ENCOUNTER — Other Ambulatory Visit: Payer: Self-pay | Admitting: *Deleted

## 2016-03-16 MED ORDER — LISINOPRIL-HYDROCHLOROTHIAZIDE 20-25 MG PO TABS: 1.0000 | ORAL_TABLET | Freq: Every day | ORAL | 3 refills | Status: DC

## 2016-03-16 MED ORDER — DICLOFENAC SODIUM 75 MG PO TBEC: 75.0000 mg | DELAYED_RELEASE_TABLET | Freq: Two times a day (BID) | ORAL | 3 refills | Status: DC

## 2016-03-16 NOTE — Telephone Encounter (Signed)
Refill request for 90 day supply.  Cypress Hinkson L, RN  

## 2016-03-16 NOTE — Telephone Encounter (Signed)
Need visit with Dr Lesbia Ottaway before further refills.

## 2016-07-21 ENCOUNTER — Other Ambulatory Visit: Payer: Self-pay | Admitting: Family Medicine

## 2016-07-24 ENCOUNTER — Ambulatory Visit: Payer: Medicare Other

## 2016-07-26 ENCOUNTER — Ambulatory Visit: Payer: Medicare Other

## 2016-07-28 ENCOUNTER — Ambulatory Visit (INDEPENDENT_AMBULATORY_CARE_PROVIDER_SITE_OTHER): Payer: Medicare Other | Admitting: *Deleted

## 2016-07-28 ENCOUNTER — Telehealth: Payer: Self-pay | Admitting: *Deleted

## 2016-07-28 DIAGNOSIS — Z111 Encounter for screening for respiratory tuberculosis: Secondary | ICD-10-CM

## 2016-07-28 NOTE — Progress Notes (Signed)
Pt presents for TB testing.  PPD placed @ 3pm in left forearm.  Pt tolerated well and will return on Monday @ 2pm for a read.  Vaneza Pickart, Salome Spotted, CMA

## 2016-07-28 NOTE — Telephone Encounter (Signed)
Disability Parking Placard form dropped off for at front desk for completion.  Verified that patient section of form has been completed.  Last DOS/WCC with PCP was 07/28/2016 Placed form in team folder to be completed by clinical staff.  Domini Vandehei Andree Elk

## 2016-07-31 ENCOUNTER — Ambulatory Visit (INDEPENDENT_AMBULATORY_CARE_PROVIDER_SITE_OTHER): Payer: Medicare Other | Admitting: *Deleted

## 2016-07-31 DIAGNOSIS — Z111 Encounter for screening for respiratory tuberculosis: Secondary | ICD-10-CM

## 2016-07-31 LAB — TB SKIN TEST
Induration: 0 mm
TB SKIN TEST: NEGATIVE

## 2016-07-31 NOTE — Telephone Encounter (Signed)
Clinical info completed on handicap placard form.  Place form in Dr. McDiarmid's box for completion.  Sarah Moody, [place]

## 2016-07-31 NOTE — Progress Notes (Signed)
   PPD Reading Note PPD read and results entered in Epic. Result: 0 mm induration. Interpretation: Negative Allergic reaction: no Letter with results given for work L. Jerilynn Feldmeier, RN, BSN   

## 2016-07-31 NOTE — Telephone Encounter (Signed)
Placard form completed by Dr. Wendy Poet and given to patient at nurse visit for PPD reading. Hubbard Hartshorn, RN, BSN

## 2016-10-23 ENCOUNTER — Other Ambulatory Visit: Payer: Self-pay | Admitting: Family Medicine

## 2016-11-20 ENCOUNTER — Other Ambulatory Visit: Payer: Self-pay | Admitting: Family Medicine

## 2016-12-20 ENCOUNTER — Other Ambulatory Visit: Payer: Self-pay | Admitting: Family Medicine

## 2016-12-26 ENCOUNTER — Other Ambulatory Visit: Payer: Self-pay | Admitting: Family Medicine

## 2017-01-05 ENCOUNTER — Other Ambulatory Visit: Payer: Self-pay | Admitting: Family Medicine
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
## 2017-09-28 ENCOUNTER — Emergency Department (HOSPITAL_COMMUNITY): Payer: Medicare Other

## 2017-09-28 ENCOUNTER — Encounter (HOSPITAL_COMMUNITY): Payer: Self-pay

## 2017-09-28 ENCOUNTER — Other Ambulatory Visit: Payer: Self-pay

## 2017-09-28 ENCOUNTER — Emergency Department (HOSPITAL_COMMUNITY)
Admission: EM | Admit: 2017-09-28 | Discharge: 2017-09-28 | Disposition: A | Discharge status: Home / Self Care | Payer: Medicare Other | Attending: Emergency Medicine | Admitting: Emergency Medicine

## 2017-09-28 DIAGNOSIS — Y939 Activity, unspecified: Secondary | ICD-10-CM | POA: Diagnosis not present

## 2017-09-28 DIAGNOSIS — Z79899 Other long term (current) drug therapy: Secondary | ICD-10-CM | POA: Diagnosis not present

## 2017-09-28 DIAGNOSIS — S8002XA Contusion of left knee, initial encounter: Secondary | ICD-10-CM

## 2017-09-28 DIAGNOSIS — Y999 Unspecified external cause status: Secondary | ICD-10-CM | POA: Diagnosis not present

## 2017-09-28 DIAGNOSIS — M172 Bilateral post-traumatic osteoarthritis of knee: Secondary | ICD-10-CM

## 2017-09-28 DIAGNOSIS — S199XXA Unspecified injury of neck, initial encounter: Secondary | ICD-10-CM | POA: Diagnosis present

## 2017-09-28 DIAGNOSIS — M25512 Pain in left shoulder: Secondary | ICD-10-CM | POA: Insufficient documentation

## 2017-09-28 DIAGNOSIS — S161XXA Strain of muscle, fascia and tendon at neck level, initial encounter: Secondary | ICD-10-CM | POA: Diagnosis not present

## 2017-09-28 DIAGNOSIS — R0789 Other chest pain: Secondary | ICD-10-CM | POA: Diagnosis not present

## 2017-09-28 DIAGNOSIS — I10 Essential (primary) hypertension: Secondary | ICD-10-CM | POA: Diagnosis not present

## 2017-09-28 DIAGNOSIS — Y92410 Unspecified street and highway as the place of occurrence of the external cause: Secondary | ICD-10-CM | POA: Insufficient documentation

## 2017-09-28 MED ORDER — DICLOFENAC SODIUM 75 MG PO TBEC: 75.0000 mg | DELAYED_RELEASE_TABLET | Freq: Two times a day (BID) | ORAL | 1 refills | Status: DC

## 2017-09-28 MED ORDER — CYCLOBENZAPRINE HCL 10 MG PO TABS: 10.0000 mg | ORAL_TABLET | Freq: Two times a day (BID) | ORAL | 0 refills | Status: DC | PRN

## 2017-09-28 MED ORDER — CYCLOBENZAPRINE HCL 10 MG PO TABS
10.0000 mg | ORAL_TABLET | Freq: Once | ORAL | Status: AC
  Administered 2017-09-28: 10 mg via ORAL
  Filled 2017-09-28: qty 1

## 2017-09-28 MED ORDER — IBUPROFEN 400 MG PO TABS
400.0000 mg | ORAL_TABLET | Freq: Once | ORAL | Status: AC
  Administered 2017-09-28: 400 mg via ORAL
  Filled 2017-09-28: qty 1
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
## 2017-10-01 DIAGNOSIS — M47812 Spondylosis without myelopathy or radiculopathy, cervical region: Secondary | ICD-10-CM

## 2017-10-01 DIAGNOSIS — M5090 Cervical disc disorder, unspecified, unspecified cervical region: Secondary | ICD-10-CM

## 2017-10-01 HISTORY — DX: Spondylosis without myelopathy or radiculopathy, cervical region: M47.812

## 2017-10-01 HISTORY — DX: Cervical disc disorder, unspecified, unspecified cervical region: M50.90

## 2017-10-01 NOTE — Assessment & Plan Note (Addendum)
09/2017 Xray left knee: Severe joint space loss in the medial and lateral compartments. Extensive osteophytosis. Extensive enthesopathic changes around the patella. No evidence for a large joint effusion. Negative for an acute fracture or dislocation. IMPRESSION: Chronic severe osteoarthritis involving the left knee.

## 2017-10-04 ENCOUNTER — Ambulatory Visit (INDEPENDENT_AMBULATORY_CARE_PROVIDER_SITE_OTHER): Payer: Medicare Other | Admitting: Family Medicine

## 2017-10-04 ENCOUNTER — Encounter: Payer: Self-pay | Admitting: Family Medicine

## 2017-10-04 VITALS — BP 125/80 | HR 103 | Temp 98.6°F | Ht 63.0 in | Wt 205.2 lb

## 2017-10-04 DIAGNOSIS — R0789 Other chest pain: Secondary | ICD-10-CM

## 2017-10-04 DIAGNOSIS — I10 Essential (primary) hypertension: Secondary | ICD-10-CM | POA: Diagnosis not present

## 2017-10-04 MED ORDER — TRAMADOL HCL 50 MG PO TABS: 50.0000 mg | ORAL_TABLET | Freq: Three times a day (TID) | ORAL | 0 refills | Status: AC | PRN

## 2017-10-04 NOTE — Patient Instructions (Signed)
It was great to meet you today! Thank you for letting me participate in your care!  Today, we discussed your continued chest pain. I am giving you a 5 day supply of Tramadol 50mg  to help when the pain gets out of control. Please use it if you need it. Continue to take your scheduled pain medications.  I will get blood work today and if your kidney function is normal I will refill your blood pressure medication.  Be well, Harolyn Rutherford, DO PGY-1, [facility]
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
## 2017-10-04 NOTE — Assessment & Plan Note (Signed)
Obtain CMP today to determine kidney function.   If normal will refill Lisinopril-HCTZ.

## 2017-10-05 LAB — COMPREHENSIVE METABOLIC PANEL WITH GFR
ALT: 14 IU/L (ref 0–32)
AST: 18 IU/L (ref 0–40)
Albumin/Globulin Ratio: 1.3 (ref 1.2–2.2)
Albumin: 4.3 g/dL (ref 3.5–4.8)
Alkaline Phosphatase: 146 IU/L — ABNORMAL HIGH (ref 39–117)
BUN/Creatinine Ratio: 14 (ref 12–28)
BUN: 16 mg/dL (ref 8–27)
Bilirubin Total: 0.3 mg/dL (ref 0.0–1.2)
CO2: 23 mmol/L (ref 20–29)
Calcium: 9.3 mg/dL (ref 8.7–10.3)
Chloride: 103 mmol/L (ref 96–106)
Creatinine, Ser: 1.14 mg/dL — ABNORMAL HIGH (ref 0.57–1.00)
GFR calc Af Amer: 55 mL/min/1.73 — ABNORMAL LOW
GFR calc non Af Amer: 48 mL/min/1.73 — ABNORMAL LOW
Globulin, Total: 3.2 g/dL (ref 1.5–4.5)
Glucose: 86 mg/dL (ref 65–99)
Potassium: 4.5 mmol/L (ref 3.5–5.2)
Sodium: 141 mmol/L (ref 134–144)
Total Protein: 7.5 g/dL (ref 6.0–8.5)

## 2017-10-11 ENCOUNTER — Other Ambulatory Visit: Payer: Self-pay | Admitting: Family Medicine

## 2017-10-11 MED ORDER — LISINOPRIL-HYDROCHLOROTHIAZIDE 20-25 MG PO TABS: 1.0000 | ORAL_TABLET | Freq: Every day | ORAL | 0 refills | Status: DC

## 2017-10-11 NOTE — Progress Notes (Signed)
Refilling patients Lisinopril-HCTZ as her kidney function and Serum Creatnine has actually improved.

## 2017-10-24 ENCOUNTER — Ambulatory Visit: Payer: Medicare Other

## 2017-11-07 ENCOUNTER — Ambulatory Visit: Payer: Medicare Other

## 2017-11-14 ENCOUNTER — Ambulatory Visit: Payer: Medicare Other | Admitting: Licensed Clinical Social Worker

## 2017-11-14 DIAGNOSIS — F4323 Adjustment disorder with mixed anxiety and depressed mood: Secondary | ICD-10-CM
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
## 2017-11-21 ENCOUNTER — Ambulatory Visit: Payer: Medicare Other

## 2017-11-28 ENCOUNTER — Ambulatory Visit: Payer: Medicare Other

## 2017-12-06 ENCOUNTER — Ambulatory Visit: Payer: Medicare Other | Admitting: Licensed Clinical Social Worker

## 2017-12-06 DIAGNOSIS — F4323 Adjustment disorder with mixed anxiety and depressed mood: Secondary | ICD-10-CM
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
## 2017-12-19 ENCOUNTER — Ambulatory Visit: Payer: Medicare Other

## 2017-12-26 ENCOUNTER — Ambulatory Visit (INDEPENDENT_AMBULATORY_CARE_PROVIDER_SITE_OTHER): Payer: Medicare Other | Admitting: Licensed Clinical Social Worker

## 2017-12-26 DIAGNOSIS — F4323 Adjustment disorder with mixed anxiety and depressed mood: Secondary | ICD-10-CM
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
## 2017-12-27 ENCOUNTER — Ambulatory Visit: Payer: Medicare Other | Admitting: Family Medicine

## 2018-01-11 ENCOUNTER — Telehealth: Payer: Self-pay | Admitting: Licensed Clinical Social Worker

## 2018-01-11 NOTE — Progress Notes (Signed)
Service : [place] F/U Call   F/U call to patient reference resources provided to get her connected tp ongoing therapy.  Patient busy and unable to speak with patient at his time.   Plan: LCSW will call back in 1 to 5 days.  Casimer Lanius, LCSW Licensed Clinical Social Worker [facility]   [phone] 12:03 PM

## 2018-01-23 ENCOUNTER — Ambulatory Visit: Payer: Medicare Other

## 2018-01-24 ENCOUNTER — Telehealth: Payer: Self-pay | Admitting: Licensed Clinical Social Worker

## 2018-01-24 ENCOUNTER — Ambulatory Visit: Payer: Medicare Other

## 2018-01-24 NOTE — Progress Notes (Signed)
Service : [place] F/U Call   F/U call to patient reference resources provided for ongoing therapy.  Patient has contacted therapist "Remo Lipps" on [employer].  Will have to pay  $150.00 upfront and does not have the money at this time. Patient will continue Brief interventions with LCSW for 3 more sessions.  She is in agreement to look into [facility] if still unable to pay $150.00 prior to time ending with LCSW.  Patient appreciative of F/U call. Intervention: Supportive Counseling, Reflective listening, emotional support   Plan: Patient will meet with LCSW next week for [facility] appointment.  Casimer Lanius, LCSW Licensed Clinical Social Worker [facility]   [phone] 11:40 AM

## 2018-01-29 ENCOUNTER — Ambulatory Visit (INDEPENDENT_AMBULATORY_CARE_PROVIDER_SITE_OTHER): Payer: Medicare Other | Admitting: Licensed Clinical Social Worker

## 2018-01-29 DIAGNOSIS — F4323 Adjustment disorder with mixed anxiety and depressed mood: Secondary | ICD-10-CM
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
## 2018-01-31 ENCOUNTER — Ambulatory Visit: Payer: Medicare Other

## 2018-01-31 ENCOUNTER — Ambulatory Visit: Payer: Medicare Other | Admitting: Family Medicine

## 2018-02-12 ENCOUNTER — Ambulatory Visit (INDEPENDENT_AMBULATORY_CARE_PROVIDER_SITE_OTHER): Payer: Medicare Other | Admitting: Licensed Clinical Social Worker

## 2018-02-12 DIAGNOSIS — F4323 Adjustment disorder with mixed anxiety and depressed mood: Secondary | ICD-10-CM
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
## 2018-02-25 ENCOUNTER — Telehealth: Payer: Self-pay | Admitting: Family Medicine

## 2018-02-25 NOTE — Telephone Encounter (Signed)
Will forward to Dr. McDiarmid to advise. Jazmin Hartsell,CMA  

## 2018-02-25 NOTE — Telephone Encounter (Signed)
Pt is calling and would like to know if her son Ryana Montecalvo MRN# [id number] can transfer his care from Dr. Grandville Silos to Dr. McDiarmid ? Blima Rich

## 2018-03-04 NOTE — Telephone Encounter (Signed)
That would be fine 

## 2018-03-04 NOTE — Telephone Encounter (Signed)
Mother informed that the change has been made.  Jazmin Hartsell,CMA

## 2018-03-05 ENCOUNTER — Ambulatory Visit: Payer: Medicare Other | Admitting: Family Medicine

## 2018-03-20 ENCOUNTER — Encounter: Payer: Self-pay | Admitting: Family Medicine

## 2018-03-21 ENCOUNTER — Ambulatory Visit (INDEPENDENT_AMBULATORY_CARE_PROVIDER_SITE_OTHER): Payer: Medicare Other | Admitting: Family Medicine

## 2018-03-21 ENCOUNTER — Encounter: Payer: Self-pay | Admitting: Family Medicine

## 2018-03-21 VITALS — BP 142/80 | HR 105 | Temp 98.3°F | Ht 63.0 in | Wt 203.0 lb

## 2018-03-21 DIAGNOSIS — M1712 Unilateral primary osteoarthritis, left knee: Secondary | ICD-10-CM

## 2018-03-21 DIAGNOSIS — Z23 Encounter for immunization: Secondary | ICD-10-CM

## 2018-03-21 DIAGNOSIS — Z79899 Other long term (current) drug therapy: Secondary | ICD-10-CM | POA: Diagnosis not present

## 2018-03-21 DIAGNOSIS — Z9189 Other specified personal risk factors, not elsewhere classified: Secondary | ICD-10-CM

## 2018-03-21 DIAGNOSIS — I10 Essential (primary) hypertension: Secondary | ICD-10-CM | POA: Diagnosis not present

## 2018-03-21 DIAGNOSIS — Z1239 Encounter for other screening for malignant neoplasm of breast: Secondary | ICD-10-CM | POA: Diagnosis not present

## 2018-03-21 DIAGNOSIS — Z1159 Encounter for screening for other viral diseases: Secondary | ICD-10-CM

## 2018-03-21 DIAGNOSIS — M172 Bilateral post-traumatic osteoarthritis of knee: Secondary | ICD-10-CM

## 2018-03-21 MED ORDER — DICLOFENAC SODIUM 75 MG PO TBEC: 75.0000 mg | DELAYED_RELEASE_TABLET | Freq: Two times a day (BID) | ORAL | 1 refills | Status: DC

## 2018-03-21 MED ORDER — LISINOPRIL-HYDROCHLOROTHIAZIDE 20-25 MG PO TABS: 1.0000 | ORAL_TABLET | Freq: Every day | ORAL | 0 refills | Status: DC

## 2018-03-21 MED ORDER — TRAMADOL HCL 50 MG PO TABS: 50.0000 mg | ORAL_TABLET | Freq: Three times a day (TID) | ORAL | 2 refills | Status: DC | PRN

## 2018-03-21 NOTE — Patient Instructions (Addendum)
Start a trial of Tramadol 50 mg tablet, one tablet twice a day if needed for knee pain unrelieved by diclofenac and acetaminophen (Tylenol).  If plan is to stop the tramadol, will need to taper off it gradually to avoid withdrawal symptoms.   Continue your diclofenac and acetaminophen for your knee osteoarthritis.   Continue your Lisinopril-hctz blood pressure medication.   We made a referral the to [place] for a breast cancer screening mammogram.  They will contact you.  We made a referral to the orthopedist, Dr Hector Shade, to evaluate and treat your knee osteoarthritis.   Dr Zyairah Wacha will call you if your tests are not good. Otherwise he will send you a letter.  If you sign up for MyChart online, you will be able to see your test results once Dr Brandon Scarbrough has reviewed them.  If you do not hear from [place] with in 2 weeks please call our office  Dr Nylia Gavina would like to see you back in 3 months to follow up your blood pressure and knee arthritis.

## 2018-03-22 ENCOUNTER — Encounter: Payer: Self-pay | Admitting: Family Medicine

## 2018-03-22 LAB — BASIC METABOLIC PANEL
BUN/Creatinine Ratio: 13 (ref 12–28)
BUN: 13 mg/dL (ref 8–27)
CALCIUM: 9.2 mg/dL (ref 8.7–10.3)
CHLORIDE: 104 mmol/L (ref 96–106)
CO2: 22 mmol/L (ref 20–29)
Creatinine, Ser: 1.02 mg/dL — ABNORMAL HIGH (ref 0.57–1.00)
GFR calc Af Amer: 63 mL/min/{1.73_m2} (ref >59)
GFR calc non Af Amer: 54 mL/min/{1.73_m2} — ABNORMAL LOW (ref >59)
GLUCOSE: 83 mg/dL (ref 65–99)
POTASSIUM: 4.1 mmol/L (ref 3.5–5.2)
SODIUM: 142 mmol/L (ref 134–144)

## 2018-03-22 LAB — HEPATITIS C ANTIBODY

## 2018-03-22 NOTE — Assessment & Plan Note (Signed)
Adequate blood pressure control.  No evidence of new end organ damage.  Tolerating medication without significant adverse effects.  Plan to continue current blood pressure regiment.   
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
## 2018-03-22 NOTE — Assessment & Plan Note (Addendum)
Established problem worsened.  First line analgesia: APAP Second line: Diclofenac 75 mg once a day Third line Tramadol 50 mg tab, one tab every 8 hours, if needed for pain.  #60 per month.   Ms Pellegrin is interested in consulting with orthopedics to see what options, including hyaluronic acid intra-articular injections, she has to increase her level of activity that is restricted bc of her knee pain. Referral was made to Dr Wanda Plump. Al[place] (Ortho).

## 2018-03-22 NOTE — Progress Notes (Signed)
Subjective:    Patient here for follow-up of elevated blood pressure.  She is not exercising and is adherent to a low-salt diet.  Blood pressure is well controlled at home. Cardiac symptoms: none. Patient denies: chest pressure/discomfort, claudication, irregular heart beat, lower extremity edema, near-syncope and syncope. Cardiovascular risk factors: advanced age (older than 53 for men, 102 for women), dyslipidemia, obesity (BMI >= 30 kg/m2) and sedentary lifestyle. Use of agents associated with hypertension: none. History of target organ damage: none.  The following portions of the patient's history were reviewed and updated as appropriate: allergies, current medications, past family history, past medical history, past social history, past surgical history and problem list.  Review of Systems Cardiovascular: negative for chest pain, claudication and dyspnea Neurological: negative for dizziness     Objective:    BP (!) 142/80   Pulse (!) 105   Temp 98.3 F (36.8 C) (Oral)   Ht 5\' 3"  (1.6 m)   Wt 203 lb (92.1 kg) Comment: weighed at home  SpO2 99%   BMI 35.96 kg/m   General Appearance:    Alert, cooperative, no distress, appears stated age  Head:    Normocephalic, without obvious abnormality, atraumatic     Ears:    EAC occluded bil with hard, dark wax        Neck:   Supple, symmetrical, trachea midline, no adenopathy;    thyroid:  no enlargement/tenderness/nodules; no carotid   bruit or JVD     Lungs:     Clear to auscultation bilaterally, respirations unlabored      Heart:    Regular rate and rhythm, S1 and S2 normal, no murmur, rub   or gallop  Breast Exam:    No tenderness, masses, or nipple abnormality                    Lymph nodes:   Cervical, supraclavicular, and axillary nodes normal  Neurologic:   Uses one point cane, pushes up on arms of chair to rise, slower gait. Speech clear Language both concrete and abstract thoughts expressed.  Goal directed.        Assessment:    Hypertension, controlled. No Evidence of target organ damage.    Plan:    Medication: continue Lisinopril-HCTZ 20-25 daily Screening labs for initial evaluation: basic metabolic panel.

## 2018-03-25 ENCOUNTER — Telehealth: Payer: Self-pay

## 2018-03-25 DIAGNOSIS — I10 Essential (primary) hypertension: Secondary | ICD-10-CM

## 2018-03-25 NOTE — Telephone Encounter (Signed)
Patient left message that a 90 day supply of her Lisinopril was supposed to be sent in.   Call back is [phone]  Danley Danker, RN [facility])

## 2018-03-26 ENCOUNTER — Encounter: Payer: Self-pay | Admitting: Family Medicine

## 2018-03-26 DIAGNOSIS — H2510 Age-related nuclear cataract, unspecified eye: Secondary | ICD-10-CM | POA: Insufficient documentation

## 2018-03-26 DIAGNOSIS — H40019 Open angle with borderline findings, low risk, unspecified eye: Secondary | ICD-10-CM

## 2018-03-26 DIAGNOSIS — H5203 Hypermetropia, bilateral: Secondary | ICD-10-CM

## 2018-03-26 DIAGNOSIS — H524 Presbyopia: Secondary | ICD-10-CM

## 2018-03-26 HISTORY — DX: Presbyopia: H52.4

## 2018-03-26 HISTORY — DX: Open angle with borderline findings, low risk, unspecified eye: H40.019

## 2018-03-26 HISTORY — DX: Age-related nuclear cataract, unspecified eye: H25.10

## 2018-03-26 HISTORY — DX: Hypermetropia, bilateral: H52.03

## 2018-03-26 MED ORDER — LISINOPRIL-HYDROCHLOROTHIAZIDE 20-25 MG PO TABS: 1.0000 | ORAL_TABLET | Freq: Every day | ORAL | 3 refills | Status: DC

## 2018-04-15 ENCOUNTER — Telehealth: Payer: Self-pay

## 2018-04-15 DIAGNOSIS — H6123 Impacted cerumen, bilateral: Secondary | ICD-10-CM

## 2018-04-15 NOTE — Telephone Encounter (Signed)
Son informed.   ,CMA
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
## 2018-04-15 NOTE — Telephone Encounter (Signed)
Unsuccessful cerumen disimpaction attempted last ov with Dr  Diantha Paxson. Doubt repeat irrigation not blind physical disimpaction in our office would be successful   Agree with referral to ENT for visualized disimpaction.   Please let patient's son his mother was referred to the ENT who disimpacted her ears before, Dr Raliegh Ip. [place].

## 2018-04-15 NOTE — Telephone Encounter (Signed)
Pts son called nurse line requesting a referral to ENT for cerumen impaction. I offered the services here, however per son the patient is really sensitive and would prefer ENT to irrigate. Pts son request this be an urgent referral. Please advise.

## 2018-04-16 NOTE — Telephone Encounter (Signed)
reviewed

## 2018-05-18 ENCOUNTER — Other Ambulatory Visit: Payer: Self-pay | Admitting: Family Medicine

## 2018-05-18 DIAGNOSIS — M1712 Unilateral primary osteoarthritis, left knee: Secondary | ICD-10-CM

## 2018-11-15 ENCOUNTER — Telehealth (INDEPENDENT_AMBULATORY_CARE_PROVIDER_SITE_OTHER): Payer: Medicare Other | Admitting: Family Medicine

## 2018-11-15 ENCOUNTER — Other Ambulatory Visit: Payer: Self-pay

## 2018-11-15 ENCOUNTER — Telehealth: Payer: Self-pay

## 2018-11-15 DIAGNOSIS — R634 Abnormal weight loss: Secondary | ICD-10-CM | POA: Insufficient documentation

## 2018-11-15 DIAGNOSIS — R630 Anorexia: Secondary | ICD-10-CM

## 2018-11-15 DIAGNOSIS — R1011 Right upper quadrant pain: Secondary | ICD-10-CM

## 2018-11-15 MED ORDER — MIRTAZAPINE 7.5 MG PO TABS: 7.5000 mg | ORAL_TABLET | Freq: Every day | ORAL | 0 refills | Status: DC

## 2018-11-15 NOTE — Progress Notes (Signed)
[place] Telemedicine Visit  Patient consented to have virtual visit. Method of visit: Telephone  Encounter participants: Patient: Sarah Moody - located at home Provider: Steve Rattler - located at [facility] Others (if applicable): Son  Chief Complaint: no appetite  HPI:  After finding out about pandemic patient had a lot of anxiety, did not want to leave house at all. She has gone out 1 time in past 3 months. She reports she has gotten weak since then. She reports mostly laying around the house. She finally let her son visit her and he is concerned. They both feel she has lost a lot of weight and estimate a 25-30 pound weight loss. She also reports hair changes. She has poor appetite, son is forcing her to eat. She is drinking ok, mostly water and boosts, she is urinating normally- no pain or fevers. She endorses some nausea and has thrown up 3 times in the past several weeks. She is having more frequent than usual bowel movements despite not eating well- she reports at baseline she has a BM 2-3 times a week and is currently moving bowels every other day. She denies seeing any bloody or dark tarry stools. She reports excessive fatigue. She feels anxious and down. She thinks about getting coronavirus and her own funeral often. She is scared to come in to the office for further work up but son is worried and adamant he will bring her in. She sleeps a lot during the day.  She took her BP and 125/68 today. She stopped taking her lisinopril about a month ago.  She has some intermittent pain around her navel over the past several weeks. She also had pain in the right side of her back below her shoulder blade yesterday and today. She put biofreeze on it and tylenol with relief. She endorses heartburn and started taking prilosec with some improvement.  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 She reports she  used to enjoy her work.    ROS: per HPI  Pertinent PMHx: HTN. No history of thyroid disorder.   Exam:  Respiratory: speaks in clear sentences  Assessment/Plan:  1. RUQ pain Attempted to order stat US but patient refused to get this done today as she "does not feel well enough". This is scheduled for Tuesday. Strict ED precautions reviewed with her and her son for worsening pain, inability to tolerate PO, fevers. Patient verbalized understanding and agreement with plan. - US Abdomen Limited RUQ; Future  2. Loss of appetite Concerning given reported weight loss. Could be due to depression/anxiety over current pandemic but would like her to be evaluated in person to rule out other etiologies as well. appt made w/ PCP for 6/25. Will start remeron every night for mood and appetite in the mean time.   Time spent during visit with patient: 22 minutes  Lucila Maine, DO PGY-3, [facility] 11/15/2018 12:40 PM

## 2018-11-15 NOTE — Telephone Encounter (Signed)
Spoke with pts son. Informed him of the instructions left by [facility]. That they are to both have mask, that pt can not eat or drink after midnight, and to inform them that he is there to help his mother. I gave pts son the address, time and instructions. Salvatore Marvel, CMA

## 2018-11-15 NOTE — Progress Notes (Signed)
BP- N/A WT-N/A- \ T-N/A  Pt states she has lost a lot of weight, but would not take it for me said she didn't have a battery in it. Pt also stated that she is not taking her BP medication. She didn't state the reason why. She also said that she cant keep any food down. Salvatore Marvel, CMA

## 2018-11-18 ENCOUNTER — Telehealth: Payer: Self-pay

## 2018-11-18 NOTE — Telephone Encounter (Signed)
Patients son calls nurse line requesting to speak with McDiarmid prior to their scheduled apt on 6/25. Please advise.   Linton Rump [phone]

## 2018-11-19 ENCOUNTER — Other Ambulatory Visit: Payer: Medicare Other

## 2018-11-20 ENCOUNTER — Telehealth: Payer: Self-pay

## 2018-11-20 NOTE — Telephone Encounter (Signed)
Pt son called again. Son needs to talk to Dr. Wendy Poet. Pt had an appt with Dr. McDiarmid for tomorrow but pt cancelled the appt. Son thinks pt is depressed. Pt is not eating much at all, she is weak and tired all the time. Son feels like his mom needs to see Dr. McDiarmid in person and needs to talk to Dr. McDiarmid as soon as he can. Please call him on [phone].

## 2018-11-20 NOTE — Telephone Encounter (Signed)
Spoke with son and he confirmed that they changed their appt from in person to virtual.  He will be at his mother's home by 8:30am.  Johnney Ou

## 2018-11-20 NOTE — Telephone Encounter (Signed)
Pt son called nurse line asking if Dr. McDiarmid would call him regarding his mom. Please call son on [phone]. Ottis Stain, CMA

## 2018-11-21 ENCOUNTER — Telehealth (INDEPENDENT_AMBULATORY_CARE_PROVIDER_SITE_OTHER): Payer: Medicare Other | Admitting: Family Medicine

## 2018-11-21 ENCOUNTER — Telehealth: Payer: Self-pay

## 2018-11-21 ENCOUNTER — Other Ambulatory Visit: Payer: Self-pay

## 2018-11-21 DIAGNOSIS — Z7189 Other specified counseling: Secondary | ICD-10-CM

## 2018-11-21 DIAGNOSIS — R2689 Other abnormalities of gait and mobility: Secondary | ICD-10-CM

## 2018-11-21 DIAGNOSIS — F39 Unspecified mood [affective] disorder: Secondary | ICD-10-CM

## 2018-11-21 DIAGNOSIS — R29898 Other symptoms and signs involving the musculoskeletal system: Secondary | ICD-10-CM

## 2018-11-21 DIAGNOSIS — R634 Abnormal weight loss: Secondary | ICD-10-CM

## 2018-11-21 NOTE — Telephone Encounter (Signed)
Pt son Linton Rump is returning a call to Dr. Wendy Poet. His phone died and didn't know a call was coming in. Please call him again at [phone]. Ottis Stain, CMA

## 2018-11-21 NOTE — Progress Notes (Signed)
Patient ID: Sarah Moody, female   DOB: 20-May-1946, 73 y.o.   MRN: [id number] [place] / E- Visit   This visit type was conducted due to national recommendations for restrictions regarding the COVID-19 Pandemic (e.g. social distancing) in an effort to limit this patient's exposure and mitigate transmission in our community.   Due to their co-morbid illnesses, this patient is at least at moderate risk for complications without adequate follow up: yes   This format is felt to be most appropriate for this patient at this time.  All issues noted in this document were discussed and addressed.  A limited physical exam was performed with this format.   Patient consented to have visit conducted via telephone  Encounter participants: Patient: Sarah Moody  Patient Location: Home Provider: Sherren Mocha Yazan Gatling at office  Others (if applicable):none  Chief Complaint: Weight loss  HPI:  Unintentional Weight Loss in Older Adults  Not much appetite. Drinks 1-2 Boost /day. Stopped taking Mirtazapine 7.5 mg daily because it made her feel strange.   Level of physical activity: Sedentary;  Not leaving her home.  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  Malignancy Hx*: No   Ongoing inflammatory or increased catabolic condition: No   Recent acute Illness: No   Emotional problems, especially depression*: Yes   Alcoholism / Substance Abuse: No   Late-life paranoia: No   Swallowing disorders: No No    Dysgeusia:No    Oral factors (e.g., poorly fitting dentures, caries): No   Food insecurity: No   Hyperthyroidism, hypothyroidism, hyperparathyroidism/hypercalcemia, hypoadrenalism:No    GI Disorders Hx* (Hiatal Hernia, Ischemic bowel, IBD, pancreatic insufficiency, PUD, GERD, celiac disease): No   Nausea and/or Vomiting: No  (Meds associated with nausea and vomiting: Abx, bisphosphonates, digoxin, dopamine agonists, metformin,  SS/NRIs, Statins, TCAs)   GI/Biliary surgeries: No   Eating problems (e.g., inability to feed self): No   Dental or denture problems: No   Low-salt, low-cholesterol diet: No   Stones, social problems (e.g., isolation, inability to obtain preferred foods): Yes, social isolation    Cognitive impairment*: No   Immunocompromised: No   Diabetes:No ;    Organ Failure (Cardiac, Respiratory, Renal, Liver): No    Autoimmune Disorders (RA, SLE, etc): No    Neurologic Conditions (Stroke, Parkinsons, Chronic Pain, Dementia): No   Symptoms:  General Thirst: No  Fever: No  Fatigue: Mental fatigue, not physical   HEENT Headache (Temporal Arteritis): No  Head cold symptoms: No  Oral sores / bad teeth: No  If dentures, well-fitting: No  Cardiovascular Abdominal pain with eating: No  Heart Failure Hx: No  Dyspnea on exertion: No   Respiratory Pulmonary Disease Hx: No  Dyspnea:  No  Cough: No  Gastrointestinal History of peptic ulcers:  No  Hisotry of GERD: No   Indigestion/heartburn: No  Epigastric Pain: No   RUQ pain: Yes, see telephone visit note by Dr Laury Deep 11/15/18:  Hematemesis: No  Nausea and/or Vomiting: No  Melena: No  Hematochezia: No  Diarrhea: No  but more frequent than usual BMs Constipation: No  Genitourinary Dysuria: No  Vaginal bleeding: No  Musculoskeletal Shoulder stiffness:  No  Muscle strength decrease: No  Joint Swelling/pain: No  Neuropsychologic Prolonged sadness: Yes  Thinks about her funeral.  Denies thoughts of harmin herself. Sleeping a lot.  Loss of pleasure: Yes  Paranoia: No  but does think a lot about the CoViD pandemic and the harm an infection  could do to her.  Anxiousness / fearfullness: yes, fearful of going outside her home  Cancer Screening History Breast Cancer: No  Cervical Cancer: No  Colorectal Cancer: No  Lung Cancer: No    Differential Diagnosis of weight loss (in order of prevalence) Depression - MDD Vs Dysthymia  Anxiety disorder - Panic with agoraphobia?  PTSD? GAD? Severe Adjustment Disorder with anxious/depressed mood Social isolation Malignancy / Blood dyscrasia * Anorexia of aging Endocrine: Diabetes Mellitus, Thyroid / parathyroid disorder / Adrenal Insufficiency Organ failures: Renal failure / Liver disease / Heart failure Functional Impairment (ADLs, iADLs) Collagen Vascular / Rheumatic disorders  LABS & IMAGING Possible Lab Tests (BOLD type w/ highest yield): CMET, LDH,  CBC with Diff, Blood Cultures x 2,  Ferritin, ESR, C- reactive protein, LDH, vitamin B12, Folate, Vitamin D,  ANA, TSH, FOBT, +/- urinalysis with microscopy and urine culture  Possible Imaging: CXR, Abdominal 2 View Xray,  Abdominal US, Thoracic/Abdominal/Pelvic CT with contrast, EGD, Colonoscopy, MRI, Nuc Med.  Age and Sex appropriate cancer screening tests: Pap smear, mammography, FOBT or FIT or Cologard, PSA, Low-dose Chest CT.  ===================================================================  Notes Weight loss associated with increase morbidity, mortality and functional decline.  20% patients with unintentional weight loss have no identifiable cause found after investigation. Appetitie naturally diminishes with age related to gastric hormone.  Food intake also deminishes with age due to decrease activity, lowered basal metabolic rate, and loss muscle mass.   ===================================================================  Common Treatments - If labs norma are normal, then further workup not necessary.  Follow up in 3 to 6 months.  - Treat underlying reversible or maximizable causes - Treat depression / anxiety - Consult with Nutritionists - Consult with PT/OT/ST - Consult with Medical SW - +/- Nutritional supplements (provide two hours before or provide after meal) - Flavor enhancement (ham, bacon, roast beef flavors spinkled on cooked food or during food prep - +/- Appetite stimulants - Remove dietary  restrictions,  if possible. Allow family to bring in patient's favorite foods and snacks.   - Change food consistency for chewing or swallowing difficulties - Stop possibly causal medications, if possible - Assess Swallow function - Address dental or denture difficulties - Treat infectious processes,  - Treat endocrine conditions - Assistance with feeding - Meals in social setting, more leisurely environment simulating in-home dining - provide multivitamin  SH: no smoking ROS: see hpi  Exam:  Respiratory: speaking in full sentence, no audible wheeze  Assessment/Plan:  Weight loss, unintentional Differential Diagnosis of weight loss (in order of prevalence) Depression - MDD Vs Dysthymia Anxiety disorder - Panic with agoraphobia?  PTSD? GAD? Severe Adjustment Disorder with anxious/depressed mood Social isolation Malignancy / Blood dyscrasia  Anorexia of aging Endocrine: Diabetes Mellitus, Thyroid / parathyroid disorder / Adrenal Insufficiency Organ failures: Renal failure / Liver disease / Heart failure Functional Impairment (ADLs, iADLs) Collagen Vascular / Rheumatic disorders  LABS & IMAGING Possible Lab Tests: CMET, LDH,  CBC with Diff,C- reactive protein, LDH, TSH, FOBT, urinalysis Possible Age and Sex appropriate cancer screening tests: Pap smear, mammography, colonoscopy  Patient declined request to come in for lab tests.  She declined referral for mammogram and for colonoscopy at this time.  She said she may consider it in the future.   Plan: Reduce dose mirtazapine to 3.75 mg qhs to see if she may tolerate. Request consultation of Ms Merilyn Baba, to assess for counseling around her fears and social isolation.   Monitor patient's course.  Try to convince Ms  Urbanski of participating in diagnostic work up of elements above.  Set up telephone visit in 2 weeks.        Mood disorder (HCC) ADE mirtazapine 7.5 mg qhs.  Recommend trial mirtazapine 3.75 mg qhs.      COVID-19 Education: The signs and symptoms of COVID-19 were discussed with the patient and how to seek care for testing (follow up with PCP or arrange E-visit).  The importance of social distancing was discussed today.  Time spent on phone with patient: 30 minutes

## 2018-11-22 DIAGNOSIS — F39 Unspecified mood [affective] disorder: Secondary | ICD-10-CM | POA: Insufficient documentation

## 2018-11-22 MED ORDER — ROLLATOR ULTRA-LIGHT MISC: 1.0000 | Freq: Every day | 0 refills | Status: DC

## 2018-11-22 MED ORDER — COMMODE 3-IN-1 MISC: 1.0000 | Freq: Every day | 0 refills | Status: DC

## 2018-11-22 NOTE — Assessment & Plan Note (Addendum)
Differential Diagnosis of weight loss (in order of prevalence) Depression - MDD Vs Dysthymia Anxiety disorder - Panic with agoraphobia?  PTSD? GAD? Severe Adjustment Disorder with anxious/depressed mood Social isolation Malignancy / Blood dyscrasia  Anorexia of aging Endocrine: Diabetes Mellitus, Thyroid / parathyroid disorder / Adrenal Insufficiency Organ failures: Renal failure / Liver disease / Heart failure Functional Impairment (ADLs, iADLs) Collagen Vascular / Rheumatic disorders  LABS & IMAGING Possible Lab Tests: CMET, LDH,  CBC with Diff,C- reactive protein, LDH, TSH, FOBT, urinalysis Possible Age and Sex appropriate cancer screening tests: Pap smear, mammography, colonoscopy  Patient declined request to come in for lab tests.  She declined referral for mammogram and for colonoscopy at this time.  She said she may consider it in the future.   Plan: Reduce dose mirtazapine to 3.75 mg qhs to see if she may tolerate. Request consultation of Sarah Moody, to assess for counseling around her fears and social isolation.   Monitor patient's course.  Try to convince Sarah Moody of participating in diagnostic work up of elements above.  Set up telephone visit in 2 weeks.

## 2018-11-22 NOTE — Telephone Encounter (Signed)
I spoke by phone with Mr Sarah Moody, son of my patient Sarah Moody.   I inofrmed him that I was unable to share any information with him about his mother.  He said he understood.  We discussed he could get his mother to give permission for me to share her healthcare information with him.  He relayed information about his mother's condition:  In summary, he sees his mother's condition stemming from a background of her multiple stressful interrelation experiences.

## 2018-11-22 NOTE — Telephone Encounter (Signed)
Phone call returned.  Note on content in previous tlelphone note.

## 2018-11-22 NOTE — Assessment & Plan Note (Signed)
ADE mirtazapine 7.5 mg qhs.  Recommend trial mirtazapine 3.75 mg qhs.

## 2018-11-26 ENCOUNTER — Telehealth: Payer: Self-pay | Admitting: Licensed Clinical Social Worker

## 2018-11-26 ENCOUNTER — Telehealth: Payer: Self-pay | Admitting: *Deleted

## 2018-11-26 DIAGNOSIS — R2689 Other abnormalities of gait and mobility: Secondary | ICD-10-CM

## 2018-11-26 DIAGNOSIS — R29898 Other symptoms and signs involving the musculoskeletal system: Secondary | ICD-10-CM

## 2018-11-26 NOTE — Telephone Encounter (Signed)
Sarah Moody wants to let Md know that pt wants to defer PT eval until Friday.   Call is that is an issue. Christen Bame, CMA

## 2018-11-26 NOTE — Telephone Encounter (Signed)
   Unsuccessful Phone Outreach Note  11/26/2018 Name: Sarah Moody MRN: [id number] DOB: April 11, 1946  Referred by: McDiarmid, Blane Ohara, MD Reason for referral : Care Coordination (for counseling ) and Depression Request for assessment and plan.  An unsuccessful telephone outreach to patient was attempted today reference the above referral.  Left voice message to call LCSW with best day and time to schedule a phone appointment.  Follow Up Plan:  If no return call is received. LCSW will call again in 3 to 7 days.  Dr. McDiarmid has been notified of this outreach and plan.  Casimer Lanius, LCSW [facility]   [phone] 2:38 PM

## 2018-12-02 NOTE — Telephone Encounter (Signed)
[facility] calls to inform MD that they will be going out for start of care on 12/05/18.  Community message sent to [employer] to ensure she has a 3 in 1 commode and rollator. Christen Bame, CMA

## 2018-12-04 NOTE — Addendum Note (Signed)
Addended by: Christen Bame D on: 12/04/2018 08:41 AM   Modules accepted: Orders

## 2018-12-04 NOTE — Telephone Encounter (Signed)
[employer] can only pull orders from other orders, not meds.  Replaced orders as needed.  Resent message to [place]. Christen Bame, CMA

## 2018-12-04 NOTE — Telephone Encounter (Signed)
Orders received and will be process now. Christen Bame, CMA

## 2018-12-10 NOTE — Telephone Encounter (Addendum)
Care Coordination  Telephone Outreach Note  12/10/2018 Name: Sarah Moody MRN: [id number] DOB: 1946-03-17  Referred by: McDiarmid, Blane Ohara, MD Reason for referral : Care Coordination (for counseling ) and Depression  Phone call to Ms. Riley Kill reference referral from Dr. McDiarmid to assess patient and connect to counselor for managing symptoms of depression with weight loss and isolation.     Plan: phone appointment schedule for next week with LCSW to assess needs, barriers and assist patient with developing a plan.  McDiarmid, Blane Ohara, MD has been notified of this outreach and Ms. [place] A Christoffersen's plan.   Casimer Lanius, [place]   [phone] 10:04 AM

## 2018-12-12 ENCOUNTER — Telehealth (INDEPENDENT_AMBULATORY_CARE_PROVIDER_SITE_OTHER): Payer: Medicare Other | Admitting: Family Medicine

## 2018-12-12 ENCOUNTER — Other Ambulatory Visit: Payer: Self-pay

## 2018-12-12 DIAGNOSIS — R634 Abnormal weight loss: Secondary | ICD-10-CM | POA: Diagnosis not present

## 2018-12-12 NOTE — Progress Notes (Signed)
[place] / E- Visit   This visit type was conducted due to national recommendations for restrictions regarding the COVID-19 Pandemic (e.g. social distancing) in an effort to limit this patient's exposure and mitigate transmission in our community.   Due to their co-morbid illnesses, this patient is at least at moderate risk for complications without adequate follow up: yes   This format is felt to be most appropriate for this patient at this time.  All issues noted in this document were discussed and addressed.  A limited physical exam was performed with this format.   Patient consented to have visit conducted via yes  Encounter participants: Patient: Sarah Moody  Patient Location: Home Provider: Sherren Mocha McDiarmid at office  Others (if applicable): Patient's sister, Mikki Santee  Chief Complaint: weight loss, poor appetite  HPI: Weight loss Decreased appetitie persists. Not eating consistently.  Having good and bad days for eating.  Taking tums which she vomited.  Continues to stay in home.  Her sister came by to help,. She is there today.  Pt has manged by herself. The 3-1 seat has helped so she is able to rise off toilet.  Feeling of fatige aand weakness persist.  [facility] [facility] nurse coming out tomorrow to discuss diet.  Ms Merilyn Baba, has set up telephone appt for next weak for counseling.   She does not want to eat the foods that the "Meals for Moms" from her insurer. She has not tried the frozen meals they sent.   No abdominal pain  ROS: No fever,  SH: no smoking  Exam:  Respiratory: speaking in full sentence, no audible wheeze  Assessment/Plan:  No problem-specific Assessment & Plan notes found for this encounter.    COVID-19 Education: The signs and symptoms of COVID-19 were discussed with the patient and how to seek care for testing (follow up with PCP or arrange E-visit).  The importance of social distancing was discussed  today.  Time spent on phone with patient: 11 minutes   .

## 2018-12-13 NOTE — Assessment & Plan Note (Signed)
Established problem No change since last interview couple weeks ago. Broad diff remains Sarah Moody agreed to come into office next Tuesday for evaluation in-person.  See 11/22/18 notes for plan workup.

## 2018-12-17 ENCOUNTER — Ambulatory Visit (INDEPENDENT_AMBULATORY_CARE_PROVIDER_SITE_OTHER): Payer: Medicare Other | Admitting: Family Medicine

## 2018-12-17 ENCOUNTER — Other Ambulatory Visit: Payer: Self-pay

## 2018-12-17 VITALS — BP 102/64 | HR 124 | Ht 63.0 in | Wt 150.1 lb

## 2018-12-17 DIAGNOSIS — R1904 Left lower quadrant abdominal swelling, mass and lump: Secondary | ICD-10-CM

## 2018-12-17 DIAGNOSIS — R1900 Intra-abdominal and pelvic swelling, mass and lump, unspecified site: Secondary | ICD-10-CM

## 2018-12-17 DIAGNOSIS — M172 Bilateral post-traumatic osteoarthritis of knee: Secondary | ICD-10-CM

## 2018-12-17 DIAGNOSIS — H6123 Impacted cerumen, bilateral: Secondary | ICD-10-CM | POA: Diagnosis not present

## 2018-12-17 DIAGNOSIS — R634 Abnormal weight loss: Secondary | ICD-10-CM | POA: Diagnosis not present

## 2018-12-17 DIAGNOSIS — I10 Essential (primary) hypertension: Secondary | ICD-10-CM

## 2018-12-17 DIAGNOSIS — Z8742 Personal history of other diseases of the female genital tract: Secondary | ICD-10-CM

## 2018-12-17 DIAGNOSIS — R1905 Periumbilic swelling, mass or lump: Secondary | ICD-10-CM

## 2018-12-17 DIAGNOSIS — E440 Moderate protein-calorie malnutrition: Secondary | ICD-10-CM

## 2018-12-17 DIAGNOSIS — D500 Iron deficiency anemia secondary to blood loss (chronic): Secondary | ICD-10-CM

## 2018-12-17 NOTE — Assessment & Plan Note (Signed)
Established problem worsened.  Low BP with orthostatic symptoms. Suspect pt on hypovolemic side with poor PO intake.   Stopped Lisinopril/hctz

## 2018-12-17 NOTE — Assessment & Plan Note (Addendum)
Established problem Uncontrolled Possible abdominal mass on palpation at level of umbilicus Abdominal or Pelvic origin? Elevated LDH, nonspecific but can be c/w advanced malignancy Elevated ESR - nonspecific but concerning for inflammatory process (infection, malignancy, autoimmune/collagen vascular, granulomatous processes, but also anemia)   HM issue: CRC screening and mammography  Needs CT AP w CM inpatient Needs blood culture x 2 to look for occult sepsis
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
## 2018-12-17 NOTE — Assessment & Plan Note (Signed)
New problem Needs ears irrigated next OV

## 2018-12-17 NOTE — Patient Instructions (Signed)
I felt a firmness in your abdomen that concerns me.    We drew lab tests and ordered a CT of your abdomen and pelvis to help figure out what Dr Aboubacar Matsuo felt.   Dr Gwendoline Judy will contact you as he gets test results and CT scan results.  We will talk about a plan after we get back all the tests and imaging results.

## 2018-12-17 NOTE — Assessment & Plan Note (Signed)
Improved establish Will see if renally tolerating diclofenac therapy May need to stop diclofenac completely

## 2018-12-18 ENCOUNTER — Encounter: Payer: Self-pay | Admitting: Family Medicine

## 2018-12-18 ENCOUNTER — Inpatient Hospital Stay (HOSPITAL_COMMUNITY)
Admission: AD | Admit: 2018-12-18 | Discharge: 2018-12-28 | DRG: 754 | Disposition: A | Discharge status: Hospice | Payer: Medicare Other | Source: Ambulatory Visit | Attending: Family Medicine | Admitting: Family Medicine

## 2018-12-18 ENCOUNTER — Telehealth: Payer: Self-pay | Admitting: Family Medicine

## 2018-12-18 ENCOUNTER — Other Ambulatory Visit: Payer: Self-pay

## 2018-12-18 ENCOUNTER — Inpatient Hospital Stay (HOSPITAL_COMMUNITY): Payer: Medicare Other

## 2018-12-18 ENCOUNTER — Encounter (HOSPITAL_COMMUNITY): Payer: Self-pay | Admitting: General Practice

## 2018-12-18 ENCOUNTER — Institutional Professional Consult (permissible substitution): Payer: Medicare Other

## 2018-12-18 DIAGNOSIS — C7802 Secondary malignant neoplasm of left lung: Secondary | ICD-10-CM | POA: Diagnosis not present

## 2018-12-18 DIAGNOSIS — R634 Abnormal weight loss: Secondary | ICD-10-CM | POA: Diagnosis not present

## 2018-12-18 DIAGNOSIS — Z515 Encounter for palliative care: Secondary | ICD-10-CM | POA: Diagnosis not present

## 2018-12-18 DIAGNOSIS — I82452 Acute embolism and thrombosis of left peroneal vein: Secondary | ICD-10-CM | POA: Diagnosis present

## 2018-12-18 DIAGNOSIS — I82431 Acute embolism and thrombosis of right popliteal vein: Secondary | ICD-10-CM | POA: Diagnosis present

## 2018-12-18 DIAGNOSIS — L8990 Pressure ulcer of unspecified site, unspecified stage: Secondary | ICD-10-CM | POA: Insufficient documentation

## 2018-12-18 DIAGNOSIS — C7911 Secondary malignant neoplasm of bladder: Secondary | ICD-10-CM | POA: Diagnosis present

## 2018-12-18 DIAGNOSIS — Z66 Do not resuscitate: Secondary | ICD-10-CM | POA: Diagnosis not present

## 2018-12-18 DIAGNOSIS — I82412 Acute embolism and thrombosis of left femoral vein: Secondary | ICD-10-CM | POA: Diagnosis present

## 2018-12-18 DIAGNOSIS — C786 Secondary malignant neoplasm of retroperitoneum and peritoneum: Secondary | ICD-10-CM | POA: Diagnosis present

## 2018-12-18 DIAGNOSIS — K219 Gastro-esophageal reflux disease without esophagitis: Secondary | ICD-10-CM | POA: Diagnosis present

## 2018-12-18 DIAGNOSIS — I2694 Multiple subsegmental pulmonary emboli without acute cor pulmonale: Secondary | ICD-10-CM | POA: Diagnosis not present

## 2018-12-18 DIAGNOSIS — Z7189 Other specified counseling: Secondary | ICD-10-CM

## 2018-12-18 DIAGNOSIS — E43 Unspecified severe protein-calorie malnutrition: Secondary | ICD-10-CM | POA: Diagnosis not present

## 2018-12-18 DIAGNOSIS — C801 Malignant (primary) neoplasm, unspecified: Secondary | ICD-10-CM | POA: Diagnosis not present

## 2018-12-18 DIAGNOSIS — D473 Essential (hemorrhagic) thrombocythemia: Secondary | ICD-10-CM | POA: Diagnosis not present

## 2018-12-18 DIAGNOSIS — D649 Anemia, unspecified: Secondary | ICD-10-CM | POA: Diagnosis not present

## 2018-12-18 DIAGNOSIS — I82442 Acute embolism and thrombosis of left tibial vein: Secondary | ICD-10-CM | POA: Diagnosis present

## 2018-12-18 DIAGNOSIS — M7989 Other specified soft tissue disorders: Secondary | ICD-10-CM | POA: Diagnosis not present

## 2018-12-18 DIAGNOSIS — Z79899 Other long term (current) drug therapy: Secondary | ICD-10-CM | POA: Diagnosis not present

## 2018-12-18 DIAGNOSIS — I82411 Acute embolism and thrombosis of right femoral vein: Secondary | ICD-10-CM | POA: Diagnosis present

## 2018-12-18 DIAGNOSIS — C799 Secondary malignant neoplasm of unspecified site: Secondary | ICD-10-CM | POA: Diagnosis not present

## 2018-12-18 DIAGNOSIS — R1909 Other intra-abdominal and pelvic swelling, mass and lump: Secondary | ICD-10-CM | POA: Diagnosis not present

## 2018-12-18 DIAGNOSIS — I82403 Acute embolism and thrombosis of unspecified deep veins of lower extremity, bilateral: Secondary | ICD-10-CM | POA: Diagnosis not present

## 2018-12-18 DIAGNOSIS — C7800 Secondary malignant neoplasm of unspecified lung: Secondary | ICD-10-CM | POA: Diagnosis present

## 2018-12-18 DIAGNOSIS — R1900 Intra-abdominal and pelvic swelling, mass and lump, unspecified site: Secondary | ICD-10-CM | POA: Diagnosis not present

## 2018-12-18 DIAGNOSIS — E440 Moderate protein-calorie malnutrition: Secondary | ICD-10-CM

## 2018-12-18 DIAGNOSIS — C785 Secondary malignant neoplasm of large intestine and rectum: Secondary | ICD-10-CM | POA: Diagnosis present

## 2018-12-18 DIAGNOSIS — I82451 Acute embolism and thrombosis of right peroneal vein: Secondary | ICD-10-CM | POA: Diagnosis present

## 2018-12-18 DIAGNOSIS — C7801 Secondary malignant neoplasm of right lung: Secondary | ICD-10-CM | POA: Diagnosis not present

## 2018-12-18 DIAGNOSIS — I10 Essential (primary) hypertension: Secondary | ICD-10-CM | POA: Diagnosis present

## 2018-12-18 DIAGNOSIS — I82432 Acute embolism and thrombosis of left popliteal vein: Secondary | ICD-10-CM | POA: Diagnosis present

## 2018-12-18 DIAGNOSIS — I82461 Acute embolism and thrombosis of right calf muscular vein: Secondary | ICD-10-CM | POA: Diagnosis present

## 2018-12-18 DIAGNOSIS — D72829 Elevated white blood cell count, unspecified: Secondary | ICD-10-CM | POA: Diagnosis present

## 2018-12-18 DIAGNOSIS — E878 Other disorders of electrolyte and fluid balance, not elsewhere classified: Secondary | ICD-10-CM | POA: Diagnosis not present

## 2018-12-18 DIAGNOSIS — C55 Malignant neoplasm of uterus, part unspecified: Secondary | ICD-10-CM | POA: Diagnosis present

## 2018-12-18 DIAGNOSIS — C787 Secondary malignant neoplasm of liver and intrahepatic bile duct: Secondary | ICD-10-CM | POA: Diagnosis present

## 2018-12-18 DIAGNOSIS — E785 Hyperlipidemia, unspecified: Secondary | ICD-10-CM | POA: Diagnosis present

## 2018-12-18 DIAGNOSIS — I82441 Acute embolism and thrombosis of right tibial vein: Secondary | ICD-10-CM | POA: Diagnosis present

## 2018-12-18 DIAGNOSIS — R109 Unspecified abdominal pain: Secondary | ICD-10-CM | POA: Diagnosis present

## 2018-12-18 DIAGNOSIS — Z791 Long term (current) use of non-steroidal anti-inflammatories (NSAID): Secondary | ICD-10-CM

## 2018-12-18 DIAGNOSIS — D500 Iron deficiency anemia secondary to blood loss (chronic): Secondary | ICD-10-CM | POA: Diagnosis not present

## 2018-12-18 DIAGNOSIS — Z20828 Contact with and (suspected) exposure to other viral communicable diseases: Secondary | ICD-10-CM | POA: Diagnosis present

## 2018-12-18 DIAGNOSIS — F329 Major depressive disorder, single episode, unspecified: Secondary | ICD-10-CM | POA: Diagnosis present

## 2018-12-18 DIAGNOSIS — R1905 Periumbilic swelling, mass or lump: Secondary | ICD-10-CM | POA: Diagnosis not present

## 2018-12-18 DIAGNOSIS — I2699 Other pulmonary embolism without acute cor pulmonale: Secondary | ICD-10-CM | POA: Diagnosis present

## 2018-12-18 DIAGNOSIS — R1906 Epigastric swelling, mass or lump: Secondary | ICD-10-CM | POA: Diagnosis not present

## 2018-12-18 DIAGNOSIS — E876 Hypokalemia: Secondary | ICD-10-CM | POA: Diagnosis not present

## 2018-12-18 HISTORY — DX: Moderate protein-calorie malnutrition: E44.0

## 2018-12-18 HISTORY — DX: Anemia, unspecified: D64.9

## 2018-12-18 LAB — CMP14+EGFR
ALT: 5 IU/L (ref 0–32)
AST: 17 IU/L (ref 0–40)
Albumin/Globulin Ratio: 0.8 — ABNORMAL LOW (ref 1.2–2.2)
Albumin: 2.9 g/dL — ABNORMAL LOW (ref 3.7–4.7)
Alkaline Phosphatase: 137 IU/L — ABNORMAL HIGH (ref 39–117)
BUN/Creatinine Ratio: 22 (ref 12–28)
BUN: 15 mg/dL (ref 8–27)
Bilirubin Total: 0.5 mg/dL (ref 0.0–1.2)
CO2: 20 mmol/L (ref 20–29)
Calcium: 9 mg/dL (ref 8.7–10.3)
Chloride: 101 mmol/L (ref 96–106)
Creatinine, Ser: 0.68 mg/dL (ref 0.57–1.00)
GFR calc Af Amer: 100 mL/min/{1.73_m2} (ref >59)
GFR calc non Af Amer: 86 mL/min/{1.73_m2} (ref >59)
Globulin, Total: 3.7 g/dL (ref 1.5–4.5)
Glucose: 94 mg/dL (ref 65–99)
Potassium: 4.6 mmol/L (ref 3.5–5.2)
Sodium: 138 mmol/L (ref 134–144)
Total Protein: 6.6 g/dL (ref 6.0–8.5)

## 2018-12-18 LAB — CBC WITH DIFFERENTIAL/PLATELET
Abs Immature Granulocytes: 0.19 10*3/uL — ABNORMAL HIGH (ref 0.00–0.07)
Basophils Absolute: 0.1 10*3/uL (ref 0.0–0.2)
Basophils Absolute: 0.1 10*3/uL (ref 0.0–0.1)
Basophils Relative: 0 %
Basos: 0 %
EOS (ABSOLUTE): 0 10*3/uL (ref 0.0–0.4)
Eos: 0 %
Eosinophils Absolute: 0 10*3/uL (ref 0.0–0.5)
Eosinophils Relative: 0 %
HCT: 25.3 % — ABNORMAL LOW (ref 36.0–46.0)
Hematocrit: 25.4 % — ABNORMAL LOW (ref 34.0–46.6)
Hemoglobin: 6.9 g/dL — CL (ref 11.1–15.9)
Hemoglobin: 6.7 g/dL — CL (ref 12.0–15.0)
Immature Grans (Abs): 0.1 10*3/uL (ref 0.0–0.1)
Immature Granulocytes: 1 %
Immature Granulocytes: 1 %
Lymphocytes Absolute: 0.9 10*3/uL (ref 0.7–3.1)
Lymphocytes Relative: 8 %
Lymphs Abs: 1.3 10*3/uL (ref 0.7–4.0)
Lymphs: 5 %
MCH: 21 pg — ABNORMAL LOW (ref 26.6–33.0)
MCH: 20.7 pg — ABNORMAL LOW (ref 26.0–34.0)
MCHC: 27.2 g/dL — ABNORMAL LOW (ref 31.5–35.7)
MCHC: 26.5 g/dL — ABNORMAL LOW (ref 30.0–36.0)
MCV: 77 fL — ABNORMAL LOW (ref 79–97)
MCV: 78.1 fL — ABNORMAL LOW (ref 80.0–100.0)
Monocytes Absolute: 0.8 10*3/uL (ref 0.1–0.9)
Monocytes Absolute: 1.3 10*3/uL — ABNORMAL HIGH (ref 0.1–1.0)
Monocytes Relative: 8 %
Monocytes: 5 %
Neutro Abs: 12.9 10*3/uL — ABNORMAL HIGH (ref 1.7–7.7)
Neutrophils Absolute: 16.1 10*3/uL — ABNORMAL HIGH (ref 1.4–7.0)
Neutrophils Relative %: 83 %
Neutrophils: 89 %
Platelets: 581 10*3/uL — ABNORMAL HIGH (ref 150–450)
Platelets: 487 10*3/uL — ABNORMAL HIGH (ref 150–400)
RBC: 3.28 x10E6/uL — ABNORMAL LOW (ref 3.77–5.28)
RBC: 3.24 MIL/uL — ABNORMAL LOW (ref 3.87–5.11)
RDW: 17.9 % — ABNORMAL HIGH (ref 11.7–15.4)
RDW: 20.4 % — ABNORMAL HIGH (ref 11.5–15.5)
WBC: 18 10*3/uL — ABNORMAL HIGH (ref 3.4–10.8)
WBC: 15.7 10*3/uL — ABNORMAL HIGH (ref 4.0–10.5)
nRBC: 0 % (ref 0.0–0.2)

## 2018-12-18 LAB — BASIC METABOLIC PANEL
Anion gap: 10 (ref 5–15)
BUN: 16 mg/dL (ref 8–23)
CO2: 22 mmol/L (ref 22–32)
Calcium: 8.2 mg/dL — ABNORMAL LOW (ref 8.9–10.3)
Chloride: 106 mmol/L (ref 98–111)
Creatinine, Ser: 0.72 mg/dL (ref 0.44–1.00)
GFR calc Af Amer: >60 mL/min (ref >60)
GFR calc non Af Amer: >60 mL/min (ref >60)
Glucose, Bld: 124 mg/dL — ABNORMAL HIGH (ref 70–99)
Potassium: 3.8 mmol/L (ref 3.5–5.1)
Sodium: 138 mmol/L (ref 135–145)

## 2018-12-18 LAB — LDL CHOLESTEROL, DIRECT: LDL Direct: 80 mg/dL (ref 0–99)

## 2018-12-18 LAB — IRON AND TIBC
Iron: 8 ug/dL — ABNORMAL LOW (ref 28–170)
Saturation Ratios: 5 % — ABNORMAL LOW (ref 10.4–31.8)
TIBC: 158 ug/dL — ABNORMAL LOW (ref 250–450)
UIBC: 150 ug/dL

## 2018-12-18 LAB — HEMOGLOBIN AND HEMATOCRIT, BLOOD
HCT: 24 % — ABNORMAL LOW (ref 36.0–46.0)
Hemoglobin: 7.2 g/dL — ABNORMAL LOW (ref 12.0–15.0)

## 2018-12-18 LAB — PREPARE RBC (CROSSMATCH)

## 2018-12-18 LAB — RETICULOCYTES
Immature Retic Fract: 29.9 % — ABNORMAL HIGH (ref 2.3–15.9)
RBC.: 3.24 MIL/uL — ABNORMAL LOW (ref 3.87–5.11)
Retic Count, Absolute: 78.4 10*3/uL (ref 19.0–186.0)
Retic Ct Pct: 2.4 % (ref 0.4–3.1)

## 2018-12-18 LAB — TSH: TSH: 2.02 u[IU]/mL (ref 0.450–4.500)

## 2018-12-18 LAB — VITAMIN B12: Vitamin B-12: 1069 pg/mL — ABNORMAL HIGH (ref 180–914)

## 2018-12-18 LAB — FERRITIN
Ferritin: 77 ng/mL (ref 15–150)
Ferritin: 54 ng/mL (ref 11–307)

## 2018-12-18 LAB — SARS CORONAVIRUS 2 BY RT PCR (HOSPITAL ORDER, PERFORMED IN ~~LOC~~ HOSPITAL LAB): SARS Coronavirus 2: NEGATIVE

## 2018-12-18 LAB — PHOSPHORUS: Phosphorus: 2.5 mg/dL (ref 2.5–4.6)

## 2018-12-18 LAB — FOLATE: Folate: 3.8 ng/mL — ABNORMAL LOW (ref >5.9)

## 2018-12-18 LAB — LIPASE: Lipase: 11 U/L — ABNORMAL LOW (ref 14–85)

## 2018-12-18 LAB — MAGNESIUM: Magnesium: 1.6 mg/dL — ABNORMAL LOW (ref 1.7–2.4)

## 2018-12-18 LAB — SEDIMENTATION RATE: Sed Rate: 77 mm/hr — ABNORMAL HIGH (ref 0–40)

## 2018-12-18 LAB — ABO/RH: ABO/RH(D): B POS

## 2018-12-18 LAB — LACTATE DEHYDROGENASE: LDH: 450 IU/L — ABNORMAL HIGH (ref 119–226)

## 2018-12-18 MED ORDER — SODIUM CHLORIDE 0.9 % IV SOLN
INTRAVENOUS | Status: DC
  Administered 2018-12-18 – 2018-12-27 (×11): via INTRAVENOUS

## 2018-12-18 MED ORDER — ACETAMINOPHEN 325 MG PO TABS
650.0000 mg | ORAL_TABLET | Freq: Three times a day (TID) | ORAL | Status: DC | PRN
  Administered 2018-12-22: 650 mg via ORAL
  Filled 2018-12-18: qty 2

## 2018-12-18 MED ORDER — TAB-A-VITE/IRON PO TABS
1.0000 | ORAL_TABLET | Freq: Every day | ORAL | Status: DC
  Administered 2018-12-18 – 2018-12-20 (×3): 1 via ORAL
  Filled 2018-12-18 (×3): qty 1

## 2018-12-18 MED ORDER — IOHEXOL 300 MG/ML  SOLN
100.0000 mL | Freq: Once | INTRAMUSCULAR | Status: AC | PRN
  Administered 2018-12-18: 100 mL via INTRAVENOUS

## 2018-12-18 MED ORDER — SODIUM CHLORIDE 0.9% IV SOLUTION
Freq: Once | INTRAVENOUS | Status: AC
  Administered 2018-12-18: 18:00:00 via INTRAVENOUS

## 2018-12-18 MED ORDER — FOLIC ACID 1 MG PO TABS
1.0000 mg | ORAL_TABLET | Freq: Every day | ORAL | Status: DC
  Administered 2018-12-18 – 2018-12-28 (×11): 1 mg via ORAL
  Filled 2018-12-18 (×11): qty 1

## 2018-12-18 NOTE — Assessment & Plan Note (Addendum)
New problem Hgb 6.9 g/dL w MCV 77 & RDW 17.9  compared to 12.6 w MCV 93 & RDW 13.3 (06/2015) Ferritin 77; ESR 77 No symptoms reported GI loss.  Never had screening colonoscopy.  Recommend recheck on admission.  Transfuse to keep Hgb > 7.0. Check Retic, iron studies.  FOBT +/- colonoscopy +/_ EGD bc chronic diclofenac use

## 2018-12-18 NOTE — Telephone Encounter (Signed)
I spoke with Mr Sarah Moody, patient's son, by phone.  I relayed that I was unable to reach her mother by her home phone.  I relayed my concern for her abnormal labs and thought that she should be admitted to the hospital for treatment and diagnostics.  He said he has not contacted his mother today, he would call her and relay my concern to her and that I would like to talk with her.  He said he would call me back once he spoke with his mother.

## 2018-12-18 NOTE — Telephone Encounter (Signed)
I spoke first with Sarah Moody, then his mother, by phone.  I let them know that the hospital was currently full.   We had submitted for an Obs med-surg bed.   Once the hospital let our office ([facility]) know that an obs bed was available, we would contact a [place] to pick up and deliver the patient to [facility] admitting.   We would also contact Sarah Moody to let him know that his mother had a hospital bed.   While Ms Muenchow feels chronically unwell, she said that she is not acutely worse than usual other than slight increase dizziness feeling.  Denies both syncope and fall since yesterday ov.

## 2018-12-18 NOTE — Telephone Encounter (Signed)
I spoke with patient, patient's son, Linton Rump and patient's sister, Ivin Booty, by phone. I relayed the significant decline in patient's hemoglobin, WBC elevation, nonspecific elevations in ESR and LDH, as well as reminding them about my concern for a palpable firmness in patient's abdomen.    I recommended admission to the hospital for observation. Will transfuse pRBC. Obtain the CT abdomen-pelvis with contrast during obs to assess nature of the possible abdominal mass on physical exam.   Will also recommend obtaining blood cultures on admission to obs given leukocytosis and unintentional weight loss to look for occult sepsis.
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
## 2018-12-18 NOTE — Progress Notes (Signed)
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  Additionally showed bilateral DVTs with bilateral lower lobe PEs and likely pulmonary infarct within RLL without associated right heart strain.  Discussed these findings with patient, including large mass within the uterus and widespread throughout her abdomen, all of which likely representing metastatic cancer.  Discussed that we do not have a finalized plan moving forward and will speak with the oncologist team, however suspect at this point any treatment would be only for prolonging life and not resolution.   In terms of bilateral DVTs with PE, patient was with unlabored breathing on room air satting appropriately.  Unfortunately, situation also challenged by current symptomatic anemia with hemoglobin 6.7 on admit.  Given symptomatic anemia with continued likely source of bleeding including metastatic disease, will continue with current management including RBC transfusion and have shared decision making discussion with patient in the morning.  She is currently comfortable and stable.  Patient appreciated discussion, with appropriate reaction to new information.  States she knew it was "something very serious" but had hoped it was not to this level.  Offered to have the chaplain come by or to additionally discuss with family, she declines at this time.  Let her know we are available at all times if she would like someone to talk to or ask more questions.   Patriciaann Clan

## 2018-12-18 NOTE — Assessment & Plan Note (Addendum)
Temporal, supraclavicular and shoulder wasting.  Decreased serum albumin.  Order Nutrition consult inpt

## 2018-12-18 NOTE — Assessment & Plan Note (Signed)
New problem Need CT AP w CM inpatient

## 2018-12-19 DIAGNOSIS — I2694 Multiple subsegmental pulmonary emboli without acute cor pulmonale: Secondary | ICD-10-CM

## 2018-12-19 DIAGNOSIS — R634 Abnormal weight loss: Secondary | ICD-10-CM

## 2018-12-19 DIAGNOSIS — R1905 Periumbilic swelling, mass or lump: Secondary | ICD-10-CM

## 2018-12-19 DIAGNOSIS — E43 Unspecified severe protein-calorie malnutrition: Secondary | ICD-10-CM | POA: Insufficient documentation

## 2018-12-19 DIAGNOSIS — D649 Anemia, unspecified: Secondary | ICD-10-CM

## 2018-12-19 LAB — COMPREHENSIVE METABOLIC PANEL
ALT: 8 U/L (ref 0–44)
AST: 15 U/L (ref 15–41)
Albumin: 1.6 g/dL — ABNORMAL LOW (ref 3.5–5.0)
Alkaline Phosphatase: 89 U/L (ref 38–126)
Anion gap: 10 (ref 5–15)
BUN: 11 mg/dL (ref 8–23)
CO2: 21 mmol/L — ABNORMAL LOW (ref 22–32)
Calcium: 7.7 mg/dL — ABNORMAL LOW (ref 8.9–10.3)
Chloride: 106 mmol/L (ref 98–111)
Creatinine, Ser: 0.63 mg/dL (ref 0.44–1.00)
GFR calc Af Amer: >60 mL/min (ref >60)
GFR calc non Af Amer: >60 mL/min (ref >60)
Glucose, Bld: 84 mg/dL (ref 70–99)
Potassium: 3.1 mmol/L — ABNORMAL LOW (ref 3.5–5.1)
Sodium: 137 mmol/L (ref 135–145)
Total Bilirubin: 0.9 mg/dL (ref 0.3–1.2)
Total Protein: 5.4 g/dL — ABNORMAL LOW (ref 6.5–8.1)

## 2018-12-19 LAB — CBC
HCT: 26.7 % — ABNORMAL LOW (ref 36.0–46.0)
Hemoglobin: 7.6 g/dL — ABNORMAL LOW (ref 12.0–15.0)
MCH: 22.4 pg — ABNORMAL LOW (ref 26.0–34.0)
MCHC: 28.5 g/dL — ABNORMAL LOW (ref 30.0–36.0)
MCV: 78.5 fL — ABNORMAL LOW (ref 80.0–100.0)
Platelets: 409 10*3/uL — ABNORMAL HIGH (ref 150–400)
RBC: 3.4 MIL/uL — ABNORMAL LOW (ref 3.87–5.11)
RDW: 19.6 % — ABNORMAL HIGH (ref 11.5–15.5)
WBC: 14 10*3/uL — ABNORMAL HIGH (ref 4.0–10.5)
nRBC: 0 % (ref 0.0–0.2)

## 2018-12-19 LAB — TYPE AND SCREEN
ABO/RH(D): B POS
Antibody Screen: NEGATIVE
Unit division: 0

## 2018-12-19 LAB — BPAM RBC
Blood Product Expiration Date: 202008172359
ISSUE DATE / TIME: 202007221741
Unit Type and Rh: 7300

## 2018-12-19 MED ORDER — POTASSIUM CHLORIDE CRYS ER 20 MEQ PO TBCR
40.0000 meq | EXTENDED_RELEASE_TABLET | ORAL | Status: AC
  Administered 2018-12-19 (×2): 40 meq via ORAL
  Filled 2018-12-19 (×2): qty 2

## 2018-12-19 MED ORDER — BOOST PLUS PO LIQD
237.0000 mL | Freq: Two times a day (BID) | ORAL | Status: DC
  Administered 2018-12-19 – 2018-12-28 (×10): 237 mL via ORAL
  Filled 2018-12-19 (×19): qty 237

## 2018-12-19 MED ORDER — MAGNESIUM SULFATE 2 GM/50ML IV SOLN
2.0000 g | Freq: Once | INTRAVENOUS | Status: AC
  Administered 2018-12-19: 2 g via INTRAVENOUS
  Filled 2018-12-19: qty 50

## 2018-12-19 MED ORDER — PANTOPRAZOLE SODIUM 40 MG PO TBEC
40.0000 mg | DELAYED_RELEASE_TABLET | Freq: Every day | ORAL | Status: DC
  Administered 2018-12-19: 40 mg via ORAL
  Filled 2018-12-19 (×2): qty 1

## 2018-12-19 NOTE — Plan of Care (Signed)
  Problem: Education: Goal: Knowledge of General Education information will improve Description: Including pain rating scale, medication(s)/side effects and non-pharmacologic comfort measures Outcome: Progressing   Problem: Safety: Goal: Ability to remain free from injury will improve Outcome: Progressing   

## 2018-12-19 NOTE — Progress Notes (Addendum)
Family Medicine Teaching Service Daily Progress Note Intern Pager: [phone]  Patient name: Sarah Moody Medical record number: [id number] Date of birth: 07-15-1945 Age: 73 y.o. Gender: female  Primary Care Provider: McDiarmid, Blane Ohara, MD Consultants: Heme/Onc Code Status:Full  Pt Overview and Major Events to Date:  07/22- Admit  Assessment and Plan:  Symptomatic Anemia-stable no dizziness, increased work of breathing, currently on room air oxygen saturation 97% -Telemery -N/S@ 93OI/ZT -Folic Acid 1mg  daily and multivitamins 1 tab daily -Hb 8.3 today, increased from admission 6.9  Weight Loss-chronic -Nutritional appreciate recs- Boost Plus chocolate BID- Each supplement provides 360kcal and 14g protein.   Magic cup TID with meals, each supplement provides 290 kcal and 9 grams of protein BMP in am  Abdominal Pain-stable pt reports some abdominal pain -Acetaminophen prn, continue to assess pain control - Protonix 40mg  BID -Carafate 1000mg  TID -Tums daily  Fatigue- stable -monitor for increasing signs of depression  Leukocytosis-stable improving WCB 14.5 today, decreased from admission 15.7 -Blood cultures-no growth >24hrs -CBC in am  FEN/GI: NPO Prophylaxis: SCD's  FEN/GI: Regular diet, Protonix PPx:SCD's  Disposition: Home when medically stable  Subjective:  Pt feeling a little better today.  Agreeable to CT chest today.  Having heartburn today, eating small amount but able to keep down a little.  She is wanting to get out of bed today. No overnight issues.  Objective: Temp:  [98 F (36.7 C)-98.7 F (37.1 C)] 98.2 F (36.8 C) (07/24 1348) Pulse Rate:  [95-107] 99 (07/24 1348) Resp:  [16-19] 16 (07/24 1348) BP: (119-133)/(69-82) 133/82 (07/24 1348) SpO2:  [99 %-100 %] 99 % (07/24 1348) Weight:  [70 kg] 70 kg (07/23 2006)   Physical Exam: General: laying in bed, NAD Cardiovascular: S1S2N, no murmurs apprecitated Respiratory: CTAB, no wheezing, no  crackles Abdomen: large firm mass and pain on palpation, BS present,Extremities: mild weakness, pulses present bilaterally  Neuro: A&Ox4 Psych: depressed mood  Laboratory: Recent Labs  Lab 12/18/18 1508 12/18/18 2237 12/19/18 0748 12/20/18 1115  WBC 15.7*  --  14.0* 14.5*  HGB 6.7* 7.2* 7.6* 8.3*  HCT 25.3* 24.0* 26.7* 29.4*  PLT 487*  --  409* 451*   Recent Labs  Lab 12/17/18 1233 12/18/18 1508 12/19/18 0748 12/20/18 1115  NA 138 138 137 138  K 4.6 3.8 3.1* 3.7  CL 101 106 106 108  CO2 20 22 21* 20*  BUN 15 16 11 9   CREATININE 0.68 0.72 0.63 0.58  CALCIUM 9.0 8.2* 7.7* 7.7*  PROT 6.6  --  5.4*  --   BILITOT 0.5  --  0.9  --   ALKPHOS 137*  --  89  --   ALT 5  --  8  --   AST 17  --  15  --   GLUCOSE 94 124* 84 93      Imaging/Diagnostic Tests: Ct Chest Wo Contrast  Result Date: 12/20/2018 CLINICAL DATA:  73 year old female with pulmonary nodules identified on recent abdominal CT. Patient with pulmonary emboli. EXAM: CT CHEST WITHOUT CONTRAST TECHNIQUE: Multidetector CT imaging of the chest was performed following the standard protocol without IV contrast. COMPARISON:  12/18/2018 abdominal CT FINDINGS: Cardiovascular: UPPER limits normal heart size noted. Coronary artery and aortic atherosclerotic calcifications noted without evidence of thoracic aortic aneurysm. No pericardial effusion. The known pulmonary emboli are not visualized on this noncontrast study. Mediastinum/Nodes: A 1 cm RIGHT retropectoral node is identified (series 3: Image 18). No other abnormal appearing lymph nodes are identified. No  discrete mediastinal mass is noted. The visualized thyroid gland and esophagus are unremarkable. No gross breast abnormalities are present. Lungs/Pleura: Multiple bilateral pulmonary nodules are identified, compatible with metastatic disease. Index nodules are as follows: A 1.5 cm nodule along the RIGHT minor fissure (4:51) A 0.8 cm RIGHT LOWER lobe nodule (4:67) A 1.2 cm  LEFT UPPER lobe nodule (4:28) A 0.9 cm LEFT UPPER lobe nodule (4:41) A 1 cm lingular nodule (4:59). Posterior RIGHT LOWER lobe focal opacity/consolidation with possible central cavitation is again identified. Small RIGHT pleural effusion versus pleural thickening is noted along the posterior RIGHT LOWER lobe. LEFT LOWER lobe patchy opacity is identified. Upper Abdomen: No significant change from recent abdominal CT with small scattered peritoneal/abdominal nodules Musculoskeletal: No acute or suspicious bony abnormalities. IMPRESSION: 1. Bilateral pulmonary nodules compatible with metastatic disease. 2. RIGHT LOWER lobe consolidation which may represent changes from known pulmonary emboli. Very small RIGHT pleural effusion versus pleural thickening. 3. Mild LEFT LOWER lobe atelectasis/airspace disease. 4. Unchanged abdominal findings from recent abdominal CT. 5. Coronary artery and Aortic Atherosclerosis (ICD10-I70.0). Electronically Signed   By: Margarette Canada M.D.   On: 12/20/2018 13:40    Carollee Leitz, MD 12/20/2018, 2:38 PM PGY-1, Advance Intern pager: [phone], text pages welcome

## 2018-12-19 NOTE — Progress Notes (Signed)
Pt. Refused to go to imaging lab today. Pt states " I did CT imaging, I already know whats happening to me". MD made aware.

## 2018-12-19 NOTE — Progress Notes (Signed)
Initial Nutrition Assessment  DOCUMENTATION CODES:   Severe malnutrition in context of acute illness/injury  INTERVENTION:    Boost Plus chocolate BID- Each supplement provides 360kcal and 14g protein.    Magic cup TID with meals, each supplement provides 290 kcal and 9 grams of protein  MVI daily  NUTRITION DIAGNOSIS:   Severe Malnutrition related to acute illness(new uterine mass with mets) as evidenced by percent weight loss, energy intake < or equal to 50% for > or equal to 5 days.  GOAL:   Patient will meet greater than or equal to 90% of their needs   MONITOR:   PO intake, Supplement acceptance, Skin, Weight trends, Labs, I & O's  REASON FOR ASSESSMENT:   Consult Assessment of nutrition requirement/status  ASSESSMENT:   Patient with PMH significant for HTN, HLD, and anemia. Presents this admission with symptomatic anemia with abdominal pain.   7/22- CT reveals large mass of uterus with mets to bowel, peritoneum, liver, and lung  RD working remotely.  Spoke with pt via phone. Reports appetite started to decline over the last 3-4 months due to fatigue/abdominal pain. States during this time she consumed mostly soup, water, and Boost High Protein (1-2 daily). Her daughter would often bring her meals but she had a difficult time keeping them down. She denies any issues with chewing or swallowing. Meal completions charted as 0% since admit. She tolerated a couple bites of pancake with OJ this am. Discussed the importance of protein intake for preservation of lean body mass. Pt amendable to Boost this stay but requests for it to be cold as warm Boost makes her nauseous.   Pt endorses a UBW of 190 lb and an unintentional wt loss of 40 lb within a 3 month time period. Records indicate pt weighed 202 lb on 03/21/18 and 155 lb this admission (23% wt loss in 9 months, significant for time frame). Unable to do NFPE. Will attempt at follow up if possible as pt is malnourished.    Drips: NS @ 75 ml/hr Medications: folic acid, MVI with minerals, 40 mEq KCl BID Labs: K 3.1 (L) Mg 1.6 (L)    Diet Order:   Diet Order            Diet regular Room service appropriate? Yes; Fluid consistency: Thin  Diet effective now              EDUCATION NEEDS:   Education needs have been addressed  Skin:  Skin Assessment: Skin Integrity Issues: Skin Integrity Issues:: Stage II Stage II: buttocks  Last BM:  7/21  Height:   Ht Readings from Last 1 Encounters:  12/18/18 5\' 3"  (1.6 m)    Weight:   Wt Readings from Last 1 Encounters:  12/18/18 70.1 kg    Ideal Body Weight:  52.3 kg  BMI:  Body mass index is 27.38 kg/m.  Estimated Nutritional Needs:   Kcal:  1750-1950 kcal  Protein:  85-105 grams  Fluid:  >/= 1.7 L/day   Mariana Single RD, LDN Clinical Nutrition Pager # - [phone]

## 2018-12-20 ENCOUNTER — Encounter (HOSPITAL_COMMUNITY): Payer: Self-pay | Admitting: Oncology

## 2018-12-20 ENCOUNTER — Inpatient Hospital Stay (HOSPITAL_COMMUNITY): Payer: Medicare Other

## 2018-12-20 DIAGNOSIS — C7801 Secondary malignant neoplasm of right lung: Secondary | ICD-10-CM

## 2018-12-20 DIAGNOSIS — I2699 Other pulmonary embolism without acute cor pulmonale: Secondary | ICD-10-CM

## 2018-12-20 DIAGNOSIS — C787 Secondary malignant neoplasm of liver and intrahepatic bile duct: Secondary | ICD-10-CM

## 2018-12-20 DIAGNOSIS — Z7189 Other specified counseling: Secondary | ICD-10-CM

## 2018-12-20 DIAGNOSIS — I82403 Acute embolism and thrombosis of unspecified deep veins of lower extremity, bilateral: Secondary | ICD-10-CM

## 2018-12-20 DIAGNOSIS — C786 Secondary malignant neoplasm of retroperitoneum and peritoneum: Secondary | ICD-10-CM

## 2018-12-20 DIAGNOSIS — C7802 Secondary malignant neoplasm of left lung: Secondary | ICD-10-CM

## 2018-12-20 DIAGNOSIS — Z515 Encounter for palliative care: Secondary | ICD-10-CM

## 2018-12-20 DIAGNOSIS — C7911 Secondary malignant neoplasm of bladder: Secondary | ICD-10-CM

## 2018-12-20 DIAGNOSIS — D473 Essential (hemorrhagic) thrombocythemia: Secondary | ICD-10-CM

## 2018-12-20 DIAGNOSIS — D72829 Elevated white blood cell count, unspecified: Secondary | ICD-10-CM

## 2018-12-20 DIAGNOSIS — C801 Malignant (primary) neoplasm, unspecified: Secondary | ICD-10-CM

## 2018-12-20 DIAGNOSIS — K219 Gastro-esophageal reflux disease without esophagitis: Secondary | ICD-10-CM

## 2018-12-20 LAB — CBC
HCT: 29.4 % — ABNORMAL LOW (ref 36.0–46.0)
Hemoglobin: 8.3 g/dL — ABNORMAL LOW (ref 12.0–15.0)
MCH: 22.9 pg — ABNORMAL LOW (ref 26.0–34.0)
MCHC: 28.2 g/dL — ABNORMAL LOW (ref 30.0–36.0)
MCV: 81 fL (ref 80.0–100.0)
Platelets: 451 10*3/uL — ABNORMAL HIGH (ref 150–400)
RBC: 3.63 MIL/uL — ABNORMAL LOW (ref 3.87–5.11)
RDW: 20.2 % — ABNORMAL HIGH (ref 11.5–15.5)
WBC: 14.5 10*3/uL — ABNORMAL HIGH (ref 4.0–10.5)
nRBC: 0 % (ref 0.0–0.2)

## 2018-12-20 LAB — BASIC METABOLIC PANEL
Anion gap: 10 (ref 5–15)
BUN: 9 mg/dL (ref 8–23)
CO2: 20 mmol/L — ABNORMAL LOW (ref 22–32)
Calcium: 7.7 mg/dL — ABNORMAL LOW (ref 8.9–10.3)
Chloride: 108 mmol/L (ref 98–111)
Creatinine, Ser: 0.58 mg/dL (ref 0.44–1.00)
GFR calc Af Amer: >60 mL/min (ref >60)
GFR calc non Af Amer: >60 mL/min (ref >60)
Glucose, Bld: 93 mg/dL (ref 70–99)
Potassium: 3.7 mmol/L (ref 3.5–5.1)
Sodium: 138 mmol/L (ref 135–145)

## 2018-12-20 LAB — PHOSPHORUS: Phosphorus: 2.6 mg/dL (ref 2.5–4.6)

## 2018-12-20 LAB — MAGNESIUM: Magnesium: 1.8 mg/dL (ref 1.7–2.4)

## 2018-12-20 LAB — TROPONIN I (HIGH SENSITIVITY): Troponin I (High Sensitivity): 11 ng/L (ref <18)

## 2018-12-20 MED ORDER — POTASSIUM CHLORIDE CRYS ER 20 MEQ PO TBCR
40.0000 meq | EXTENDED_RELEASE_TABLET | Freq: Two times a day (BID) | ORAL | Status: DC
  Filled 2018-12-20 (×2): qty 2

## 2018-12-20 MED ORDER — PANTOPRAZOLE SODIUM 40 MG PO TBEC
40.0000 mg | DELAYED_RELEASE_TABLET | Freq: Two times a day (BID) | ORAL | Status: DC
  Administered 2018-12-20 – 2018-12-28 (×17): 40 mg via ORAL
  Filled 2018-12-20 (×16): qty 1

## 2018-12-20 MED ORDER — SUCRALFATE 1 GM/10ML PO SUSP
1.0000 g | Freq: Three times a day (TID) | ORAL | Status: DC
  Administered 2018-12-20 – 2018-12-21 (×5): 1 g via ORAL
  Filled 2018-12-20 (×6): qty 10

## 2018-12-20 MED ORDER — CALCIUM CARBONATE ANTACID 500 MG PO CHEW
1.0000 | CHEWABLE_TABLET | Freq: Once | ORAL | Status: DC
  Filled 2018-12-20: qty 1

## 2018-12-20 NOTE — Consult Note (Addendum)
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 I was able to pray for her.  I spent about an hour with her.  Again, I think she has a very good idea as to what she is dealing with.  We just need to confirm this with a biopsy.  I probably would biopsy 1 of the subcutaneous or peritoneal metastasis.  This would be easiest.  We will follow her  along.  I would give her IV iron.  I would not give her oral iron as I think this probably would cause some dyspepsia.  Please check iron studies that include ferritin, TIBC/iron.   Lattie Haw, MD  Oswaldo Milian 41:10

## 2018-12-20 NOTE — Care Management Important Message (Signed)
Important Message  Patient Details  Name: Sarah Moody MRN: [id number] Date of Birth: 1945/06/09   Medicare Important Message Given:  Yes     Orbie Pyo 12/20/2018, 2:03 PM
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
## 2018-12-20 NOTE — Progress Notes (Signed)
Nurse call with patient complaining of burning reflux sensation in her chest that is "killing her". Went to examine patient. Patient describes pain as a burning sensation in the central part of her chest and that she has had some vomiting with it. She says it is very similar to her previous bouts of heart burn in the past. She denies shortness of breath.  Her main admission complaint was for abdominal pain and all of her exams have been non-surgical and consistent with her metastatic cancer, this complaint was a consistent GERD pain in her chest and my findings were as follows:  General: Alert and oriented in no apparent distress Heart: Regular rate and rhythm with no murmurs appreciated Lungs: CTA bilaterally, no wheezing  STAT ekg and high sensitivity troponin x2 ordered. Patient was able to take her Carafate and did not vomit it up. Will monitor closely.  Lurline Del, DO

## 2018-12-21 LAB — RETICULOCYTES
Immature Retic Fract: 25.3 % — ABNORMAL HIGH (ref 2.3–15.9)
RBC.: 3.37 MIL/uL — ABNORMAL LOW (ref 3.87–5.11)
Retic Count, Absolute: 59 10*3/uL (ref 19.0–186.0)
Retic Ct Pct: 1.8 % (ref 0.4–3.1)

## 2018-12-21 LAB — IRON AND TIBC
Iron: 6 ug/dL — ABNORMAL LOW (ref 28–170)
Saturation Ratios: 5 % — ABNORMAL LOW (ref 10.4–31.8)
TIBC: 116 ug/dL — ABNORMAL LOW (ref 250–450)
UIBC: 110 ug/dL

## 2018-12-21 LAB — BASIC METABOLIC PANEL
Anion gap: 11 (ref 5–15)
BUN: 8 mg/dL (ref 8–23)
CO2: 17 mmol/L — ABNORMAL LOW (ref 22–32)
Calcium: 7.5 mg/dL — ABNORMAL LOW (ref 8.9–10.3)
Chloride: 108 mmol/L (ref 98–111)
Creatinine, Ser: 0.66 mg/dL (ref 0.44–1.00)
GFR calc Af Amer: >60 mL/min (ref >60)
GFR calc non Af Amer: >60 mL/min (ref >60)
Glucose, Bld: 80 mg/dL (ref 70–99)
Potassium: 3.1 mmol/L — ABNORMAL LOW (ref 3.5–5.1)
Sodium: 136 mmol/L (ref 135–145)

## 2018-12-21 LAB — FERRITIN: Ferritin: 61 ng/mL (ref 11–307)

## 2018-12-21 LAB — CBC
HCT: 26.5 % — ABNORMAL LOW (ref 36.0–46.0)
Hemoglobin: 7.4 g/dL — ABNORMAL LOW (ref 12.0–15.0)
MCH: 22.9 pg — ABNORMAL LOW (ref 26.0–34.0)
MCHC: 27.9 g/dL — ABNORMAL LOW (ref 30.0–36.0)
MCV: 82 fL (ref 80.0–100.0)
Platelets: 447 10*3/uL — ABNORMAL HIGH (ref 150–400)
RBC: 3.23 MIL/uL — ABNORMAL LOW (ref 3.87–5.11)
RDW: 20.5 % — ABNORMAL HIGH (ref 11.5–15.5)
WBC: 15.2 10*3/uL — ABNORMAL HIGH (ref 4.0–10.5)
nRBC: 0 % (ref 0.0–0.2)

## 2018-12-21 LAB — TROPONIN I (HIGH SENSITIVITY): Troponin I (High Sensitivity): 10 ng/L (ref <18)

## 2018-12-21 LAB — VITAMIN B12: Vitamin B-12: 980 pg/mL — ABNORMAL HIGH (ref 180–914)

## 2018-12-21 LAB — FOLATE: Folate: 6.9 ng/mL (ref >5.9)

## 2018-12-21 MED ORDER — POTASSIUM CHLORIDE 10 MEQ/100ML IV SOLN: 10.0000 meq | INTRAVENOUS | Status: AC

## 2018-12-21 MED ORDER — POTASSIUM CHLORIDE 10 MEQ/100ML IV SOLN
10.0000 meq | INTRAVENOUS | Status: DC
  Administered 2018-12-21 (×2): 10 meq via INTRAVENOUS
  Filled 2018-12-21 (×3): qty 100

## 2018-12-21 MED ORDER — HEPARIN SODIUM (PORCINE) 5000 UNIT/ML IJ SOLN
5000.0000 [IU] | Freq: Three times a day (TID) | INTRAMUSCULAR | Status: DC
  Administered 2018-12-21 – 2018-12-22 (×3): 5000 [IU] via SUBCUTANEOUS
  Filled 2018-12-21 (×3): qty 1

## 2018-12-21 NOTE — Progress Notes (Signed)
  Palliative medicine progress note  Patient seen, chart reviewed.  Patient reports feeling better today, less weak.  Her reflux is much improved and she is been able to eat a few bites and sips today without distress.  She met with oncologist, Dr. Marin Olp, last night.  She is agreeable to go forward with biopsy probably on Monday before making further decisions regarding goals of care.  I do get the sense though that she would not pursue chemo or radiation.  1 of her chief concerns regarding end-of-life is more what happens before, in terms of symptom management, versus after.  She describes herself as a woman of faith.  She states she is not afraid.  She worries about her son and her sister after she is gone  Patient Profile: 73 y.o. female  with past medical history of hypertension, anemia admitted on 12/18/2018 with increased weakness, decreased oral intake, nausea and vomiting and shortness of breath.  Patient was also reporting a 50 pound weight loss over 4 months.  Upon admission she was found to have a hemoglobin of 6.9.  A palpable abdominal mass was also found on initial assessment.  Further CT scan of the abdomen revealed a large uterine mass with mets throughout the bowel, peritoneum liver, and bladder wall lesions.  Consult ordered for goals of care in the setting of new diagnosis of cancer.  She has met with Dr. Burney Gauze regarding oncological work-up, biopsy  Plan Patient needs ongoing goals of care but wishes to pursue this after further discussions with Dr. Marin Olp after biopsy results are obtained My sense is that Dr. Vikki Ports and Dr. Marin Olp will be a huge source of support for Mrs. Rennie and her family regarding decisions. She is quite concerned about symptom management.  I have begun discussions regarding hospice as a means of ensuring adequate symptom management I did arrange for her son to come visit today.  His name has been left with the main entrance desk Palliative  medicine to stay involved to continue goals of care after she receives more information regarding her cancer, after biopsy  Disposition To be determined  Prognosis Patient would likely meet her hospice Medicare benefit in the home or facility but not if she is undergoing chemo, with a prognosis of less than 6 months  Thank you, Romona Curls, NP Total time: 25 minutes Greater than 50% of time was spent in counseling and coordination of care

## 2018-12-21 NOTE — Progress Notes (Addendum)
Family Medicine Teaching Service Daily Progress Note Intern Pager: [phone]  Patient name: Sarah Moody Medical record number: [id number] Date of birth: [birth year] Age: 73 y.o. Gender: female  Primary Care Provider: McDiarmid, Blane Ohara, MD Consultants: Heme/Onc Code Status:Full  Pt Overview and Major Events to Date:  07/22- Admit  Assessment and Plan:  Symptomatic Anemia-stable no dizziness, increased work of breathing, currently on room air oxygen saturation 97% -Telemery -N/S@ 31VQ/MG -Folic Acid 1mg  daily and multivitamins 1 tab daily -Hb 7.4 today, increased from admission 6.9  Metastatic Ca -Heme/Onc consult, appreciate recs -Consider anticoagulation and IV iron -Anemia panel -biopsy on Mon to confirm diagnosis  Weight Loss-chronic -Nutritional appreciate recs- Boost Plus chocolate BID- Each supplement provides 360kcal and 14g protein.   Magic cup TID with meals, each supplement provides 290 kcal and 9 grams of protein BMP in am  Abdominal Pain-stable pt reports mild abdominal pain -Acetaminophen prn, continue to assess pain control -Protonix 40mg  BID -Carafate 1000mg  TID -Tums daily  Leukocytosis-stable WCB 15.2 today, decreased from admission 15.7 -CBC in am  FEN/GI: Regular diet, Protonix PPx: SCD's  Disposition: Home when medically stable  Subjective:  Pt feeling better and reports having more energy.  Appetite a little better, able to eat some bacon and grits  this morning. Denies chest pain, SOB.  Abdo pain when repositioning. C/o of sore backside, staff applying cream. No overnight issues.    Objective: Temp:  [97.8 F (36.6 C)-99 F (37.2 C)] 98 F (36.7 C) (07/25 0935) Pulse Rate:  [97-104] 102 (07/25 0935) Resp:  [16-18] 18 (07/25 0935) BP: (102-133)/(69-87) 123/69 (07/25 0935) SpO2:  [99 %-100 %] 100 % (07/25 0935) Weight:  [70.1 kg] 70.1 kg (07/24 1959)   Physical Exam: General: laying in bed, NAD Cardiovascular: S1S2N, no murmurs  apprecitated Respiratory: CTAB, no wheezing, no crackles Abdomen: large firm mass and pain on palpation, BS present,Extremities: mild weakness, pulses present bilaterally  Neuro: A&Ox4 Psych: mood improved.  Laboratory: Recent Labs  Lab 12/19/18 0748 12/20/18 1115 12/21/18 0515  WBC 14.0* 14.5* 15.2*  HGB 7.6* 8.3* 7.4*  HCT 26.7* 29.4* 26.5*  PLT 409* 451* 447*   Recent Labs  Lab 12/17/18 1233  12/19/18 0748 12/20/18 1115 12/21/18 0515  NA 138   < > 137 138 136  K 4.6   < > 3.1* 3.7 3.1*  CL 101   < > 106 108 108  CO2 20   < > 21* 20* 17*  BUN 15   < > 11 9 8   CREATININE 0.68   < > 0.63 0.58 0.66  CALCIUM 9.0   < > 7.7* 7.7* 7.5*  PROT 6.6  --  5.4*  --   --   BILITOT 0.5  --  0.9  --   --   ALKPHOS 137*  --  89  --   --   ALT 5  --  8  --   --   AST 17  --  15  --   --   GLUCOSE 94   < > 84 93 80   < > = values in this interval not displayed.      Imaging/Diagnostic Tests: Ct Chest Wo Contrast  Result Date: 12/20/2018 CLINICAL DATA:  73 year old female with pulmonary nodules identified on recent abdominal CT. Patient with pulmonary emboli. EXAM: CT CHEST WITHOUT CONTRAST TECHNIQUE: Multidetector CT imaging of the chest was performed following the standard protocol without IV contrast. COMPARISON:  12/18/2018 abdominal CT FINDINGS: Cardiovascular:  UPPER limits normal heart size noted. Coronary artery and aortic atherosclerotic calcifications noted without evidence of thoracic aortic aneurysm. No pericardial effusion. The known pulmonary emboli are not visualized on this noncontrast study. Mediastinum/Nodes: A 1 cm RIGHT retropectoral node is identified (series 3: Image 18). No other abnormal appearing lymph nodes are identified. No discrete mediastinal mass is noted. The visualized thyroid gland and esophagus are unremarkable. No gross breast abnormalities are present. Lungs/Pleura: Multiple bilateral pulmonary nodules are identified, compatible with metastatic disease.  Index nodules are as follows: A 1.5 cm nodule along the RIGHT minor fissure (4:51) A 0.8 cm RIGHT LOWER lobe nodule (4:67) A 1.2 cm LEFT UPPER lobe nodule (4:28) A 0.9 cm LEFT UPPER lobe nodule (4:41) A 1 cm lingular nodule (4:59). Posterior RIGHT LOWER lobe focal opacity/consolidation with possible central cavitation is again identified. Small RIGHT pleural effusion versus pleural thickening is noted along the posterior RIGHT LOWER lobe. LEFT LOWER lobe patchy opacity is identified. Upper Abdomen: No significant change from recent abdominal CT with small scattered peritoneal/abdominal nodules Musculoskeletal: No acute or suspicious bony abnormalities. IMPRESSION: 1. Bilateral pulmonary nodules compatible with metastatic disease. 2. RIGHT LOWER lobe consolidation which may represent changes from known pulmonary emboli. Very small RIGHT pleural effusion versus pleural thickening. 3. Mild LEFT LOWER lobe atelectasis/airspace disease. 4. Unchanged abdominal findings from recent abdominal CT. 5. Coronary artery and Aortic Atherosclerosis (ICD10-I70.0). Electronically Signed   By: Margarette Canada M.D.   On: 12/20/2018 13:40    Carollee Leitz, MD 12/21/2018, 10:25 AM PGY-1, [facility] Intern pager: [phone], text pages welcome

## 2018-12-22 DIAGNOSIS — R1909 Other intra-abdominal and pelvic swelling, mass and lump: Secondary | ICD-10-CM

## 2018-12-22 LAB — CBC
HCT: 26.3 % — ABNORMAL LOW (ref 36.0–46.0)
Hemoglobin: 7.5 g/dL — ABNORMAL LOW (ref 12.0–15.0)
MCH: 23 pg — ABNORMAL LOW (ref 26.0–34.0)
MCHC: 28.5 g/dL — ABNORMAL LOW (ref 30.0–36.0)
MCV: 80.7 fL (ref 80.0–100.0)
Platelets: 423 10*3/uL — ABNORMAL HIGH (ref 150–400)
RBC: 3.26 MIL/uL — ABNORMAL LOW (ref 3.87–5.11)
RDW: 20.8 % — ABNORMAL HIGH (ref 11.5–15.5)
WBC: 11.3 10*3/uL — ABNORMAL HIGH (ref 4.0–10.5)
nRBC: 0 % (ref 0.0–0.2)

## 2018-12-22 LAB — BASIC METABOLIC PANEL
Anion gap: 9 (ref 5–15)
BUN: 8 mg/dL (ref 8–23)
CO2: 17 mmol/L — ABNORMAL LOW (ref 22–32)
Calcium: 7.4 mg/dL — ABNORMAL LOW (ref 8.9–10.3)
Chloride: 108 mmol/L (ref 98–111)
Creatinine, Ser: 0.68 mg/dL (ref 0.44–1.00)
GFR calc Af Amer: >60 mL/min (ref >60)
GFR calc non Af Amer: >60 mL/min (ref >60)
Glucose, Bld: 75 mg/dL (ref 70–99)
Potassium: 3.5 mmol/L (ref 3.5–5.1)
Sodium: 134 mmol/L — ABNORMAL LOW (ref 135–145)

## 2018-12-22 MED ORDER — SODIUM CHLORIDE 0.9 % IV SOLN
510.0000 mg | Freq: Once | INTRAVENOUS | Status: AC
  Administered 2018-12-22: 510 mg via INTRAVENOUS
  Filled 2018-12-22: qty 17

## 2018-12-22 MED ORDER — HEPARIN SODIUM (PORCINE) 5000 UNIT/ML IJ SOLN
5000.0000 [IU] | Freq: Three times a day (TID) | INTRAMUSCULAR | Status: AC
  Administered 2018-12-22 (×2): 5000 [IU] via SUBCUTANEOUS
  Filled 2018-12-22 (×2): qty 1

## 2018-12-22 NOTE — Progress Notes (Signed)
Family Medicine Teaching Service Daily Progress Note Intern Pager: [phone]  Patient name: Sarah Moody Medical record number: [id number] Date of birth: [birth year] Age: 73 y.o. Gender: female  Primary Care Provider: McDiarmid, Blane Ohara, MD Consultants: Heme/Onc Code Status:Full  Pt Overview and Major Events to Date:  07/22- Admit  Assessment and Plan:  Symptomatic Anemia-stable, but was started on heparin yesterday for likely metastatic related PE/DVT.  Symptoms improving- no dizziness, increased work of breathing, currently on room air.  Iron was low on anemia panel. -N/S@ 83TD/VV -Folic Acid 1mg  daily and multivitamins 1 tab daily -Hb 7.6 July 26, increased from admission 6.9 now status post 1 unit -IV fereheme 510mg  July 26  Likely Metastatic Ca-  -Heme/Onc consult, appreciate recs -IR consulted for liver biopsy (hopefully Monday July 27, not confirmed) -NPO @midnight  July 26  Weight Loss-chronic, weight charted unreliable -Nutritional appreciate recs- Boost Plus chocolate BID- Each supplement provides 360kcal and 14g protein.   Magic cup TID with meals, each supplement provides 290 kcal and 9 grams of protein -BMP each morning -Magnesium and phosphorus check July 27  Abdominal Pain-stable, only positional -Acetaminophen prn, continue to assess pain control -Protonix 40mg  BID -Tums daily  Leukocytosis-improving WCB 11.3 7/26, decreased from admission 15.7 -CBC in am  FEN/GI: Regular diet, Protonix PPx: SCD's  Disposition: pending biopsy  Subjective:  Symptomatically improving, says she is having some depression issues related to not decide her outcome but the questions about how she will feel towards the end of her life.  Declines offer of medication.  Specifically wants to relay things to Dr. McDiarmid for his care and getting her set up here in the hospital and would like him kept updated on her status.  We did discuss heparin in some of the pros and cons of  starting this medicine she agrees to continue.   Objective: Temp:  [97.5 F (36.4 C)-98.4 F (36.9 C)] 98.3 F (36.8 C) (07/26 0609) Pulse Rate:  [96-102] 99 (07/26 0609) Resp:  [18-25] 25 (07/26 0609) BP: (122-135)/(69-82) 128/73 (07/26 0609) SpO2:  [97 %-100 %] 97 % (07/26 0609) Weight:  [77.9 kg] 77.9 kg (07/26 6160)   Physical Exam: General: Laying in bed, looks improved from admission. Cardiovascular: Regular rate and rhythm Respiratory: No increased work of breathing no wheezing no crackles no cough during exam Abdomen: Abdomen was obviously palpable abnormality consistent with uterine finding on image  extremities: mild weakness, pulses present bilaterally  Neuro: No gross deficits noted Psych: ANO x3, able to competently discuss her medication plan  Laboratory: Recent Labs  Lab 12/20/18 1115 12/21/18 0515 12/22/18 0548  WBC 14.5* 15.2* 11.3*  HGB 8.3* 7.4* 7.5*  HCT 29.4* 26.5* 26.3*  PLT 451* 447* 423*   Recent Labs  Lab 12/17/18 1233  12/19/18 0748 12/20/18 1115 12/21/18 0515 12/22/18 0548  NA 138   < > 137 138 136 134*  K 4.6   < > 3.1* 3.7 3.1* 3.5  CL 101   < > 106 108 108 108  CO2 20   < > 21* 20* 17* 17*  BUN 15   < > 11 9 8 8   CREATININE 0.68   < > 0.63 0.58 0.66 0.68  CALCIUM 9.0   < > 7.7* 7.7* 7.5* 7.4*  PROT 6.6  --  5.4*  --   --   --   BILITOT 0.5  --  0.9  --   --   --   ALKPHOS 137*  --  89  --   --   --   ALT 5  --  8  --   --   --   AST 17  --  15  --   --   --   GLUCOSE 94   < > 84 93 80 75   < > = values in this interval not displayed.      Imaging/Diagnostic Tests: No results found.  Sherene Sires, DO 12/22/2018, 8:13 AM PGY-3, [place] Intern pager: [phone], text pages welcome
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
## 2018-12-23 ENCOUNTER — Inpatient Hospital Stay (HOSPITAL_COMMUNITY): Payer: Medicare Other

## 2018-12-23 ENCOUNTER — Ambulatory Visit (HOSPITAL_COMMUNITY): Admission: RE | Admit: 2018-12-23 | Payer: Medicare Other | Source: Ambulatory Visit

## 2018-12-23 DIAGNOSIS — M7989 Other specified soft tissue disorders: Secondary | ICD-10-CM

## 2018-12-23 DIAGNOSIS — I2699 Other pulmonary embolism without acute cor pulmonale: Secondary | ICD-10-CM

## 2018-12-23 DIAGNOSIS — R1900 Intra-abdominal and pelvic swelling, mass and lump, unspecified site: Secondary | ICD-10-CM

## 2018-12-23 LAB — CBC
HCT: 27.3 % — ABNORMAL LOW (ref 36.0–46.0)
Hemoglobin: 7.6 g/dL — ABNORMAL LOW (ref 12.0–15.0)
MCH: 22.8 pg — ABNORMAL LOW (ref 26.0–34.0)
MCHC: 27.8 g/dL — ABNORMAL LOW (ref 30.0–36.0)
MCV: 82 fL (ref 80.0–100.0)
Platelets: 506 10*3/uL — ABNORMAL HIGH (ref 150–400)
RBC: 3.33 MIL/uL — ABNORMAL LOW (ref 3.87–5.11)
RDW: 21.2 % — ABNORMAL HIGH (ref 11.5–15.5)
WBC: 11.8 10*3/uL — ABNORMAL HIGH (ref 4.0–10.5)
nRBC: 0 % (ref 0.0–0.2)

## 2018-12-23 LAB — CULTURE, BLOOD (ROUTINE X 2)
Culture: NO GROWTH
Culture: NO GROWTH
Special Requests: ADEQUATE

## 2018-12-23 LAB — BASIC METABOLIC PANEL
Anion gap: 11 (ref 5–15)
BUN: 8 mg/dL (ref 8–23)
CO2: 17 mmol/L — ABNORMAL LOW (ref 22–32)
Calcium: 7.5 mg/dL — ABNORMAL LOW (ref 8.9–10.3)
Chloride: 107 mmol/L (ref 98–111)
Creatinine, Ser: 0.62 mg/dL (ref 0.44–1.00)
GFR calc Af Amer: >60 mL/min (ref >60)
GFR calc non Af Amer: >60 mL/min (ref >60)
Glucose, Bld: 69 mg/dL — ABNORMAL LOW (ref 70–99)
Potassium: 3.4 mmol/L — ABNORMAL LOW (ref 3.5–5.1)
Sodium: 135 mmol/L (ref 135–145)

## 2018-12-23 LAB — MAGNESIUM: Magnesium: 1.4 mg/dL — ABNORMAL LOW (ref 1.7–2.4)

## 2018-12-23 LAB — PHOSPHORUS: Phosphorus: 2.5 mg/dL (ref 2.5–4.6)

## 2018-12-23 LAB — PROTIME-INR
INR: 1.3 — ABNORMAL HIGH (ref 0.8–1.2)
Prothrombin Time: 16 seconds — ABNORMAL HIGH (ref 11.4–15.2)

## 2018-12-23 MED ORDER — MAGNESIUM SULFATE 2 GM/50ML IV SOLN
2.0000 g | Freq: Once | INTRAVENOUS | Status: AC
  Administered 2018-12-23: 2 g via INTRAVENOUS
  Filled 2018-12-23: qty 50

## 2018-12-23 MED ORDER — POTASSIUM CHLORIDE CRYS ER 10 MEQ PO TBCR
40.0000 meq | EXTENDED_RELEASE_TABLET | Freq: Once | ORAL | Status: AC
  Administered 2018-12-23: 40 meq via ORAL
  Filled 2018-12-23: qty 4

## 2018-12-23 NOTE — Progress Notes (Signed)
PT Cancellation Note  Patient Details Name: Sarah Moody MRN: [id number] DOB: [birth year]   Cancelled Treatment:    Reason Eval/Treat Not Completed: Patient declined, no reason specified Pt very anxious about upcoming biopsy and refusing to participate this session. Asked PT to return tomorrow and she would be ready to get up. Will follow up as schedule allows.   Leighton Ruff, PT, DPT  Acute Rehabilitation Services  Pager: [phone] Office: [phone]    Rudean Hitt 12/23/2018, 2:16 PM

## 2018-12-23 NOTE — Care Management Important Message (Signed)
Important Message  Patient Details  Name: Sarah Moody MRN: [id number] Date of Birth: December 09, 1945   Medicare Important Message Given:  Yes     Orbie Pyo 12/23/2018, 2:56 PM

## 2018-12-23 NOTE — Progress Notes (Signed)
IR.  Patient was scheduled for an image-guided liver lesion versus abdominal wall mass biopsy tentative for today in IR. Due to scheduling issues, procedure has been rescheduled for 12/24/2018. Will restart diet today and patient to be NPO at midnight. Hold all blood thinners at midnight. Ronalee Belts, RN aware.  Please call IR with questions/concerns.   Bea Graff Louk, PA-C 12/23/2018, 4:03 PM

## 2018-12-23 NOTE — Progress Notes (Signed)
Family Medicine Teaching Service Daily Progress Note Intern Pager: [phone]  Patient name: Sarah Moody Medical record number: [id number] Date of birth: 02-29-1946 Age: 73 y.o. Gender: female  Primary Care Provider: McDiarmid, Blane Ohara, MD Consultants: Heme/Onc Code Status:Full  Pt Overview and Major Events to Date:  07/22- Admit  Assessment and Plan:  Symptomatic Anemia-stable Denies feeling dizzy -Hb today 7.6, 7.5 on 7/26, increased from admission 6.9 now status post 1 unit RBC -had IV fereheme 510mg  on 7/26 -Started on heparin on 7/25 for PE/DVTs likely related to metastasis, last dose last night at 9 PM -Discuss with Dr Nori Riis when to restart Heparin  -Continue N/S at 93GH/WE -Folic Acid 1mg  daily and multivitamins 1 tab daily unit  Likely Metastatic Ca  -Heme/Onc consult, appreciate recs -IR consulted for US guided liver biopsy of liver lesion/sub-q abdominal nodule-many thanks for recs-planning for procedure today  Weight Loss-chronic, weight charted unreliable -Nutritional appreciate recs- Boost Plus chocolate BID- Each supplement provides 360kcal and 14g protein.   Magic cup TID with meals, each supplement provides 290 kcal and 9 grams of protein -BMP each morning -Magnesium and phosphorus due today  Abdominal Pain-stable, only positional -Acetaminophen prn, continue to assess pain control -Protonix 40mg  BID -Tums daily  Leukocytosis-improving WCB 11.3 7/26, decreased from admission 15.7 -CBC in am  FEN/GI: Regular diet, Protonix PPx: SCD's  Post round addendum PT OT referral IR biopsy result Monitor hemoglobin Monitor potassium Monitor magnesium-1.4 today Dr. Nori Riis would like patient on prophylactic heparin, see oncology note for heparin prophylactic or treatment dose  Disposition: pending biopsy of liver nodule for abdominal nodule  Subjective:  Pt reports low mood, not related to cancer but more related to not mobilizing out of bed. She is  frustrated that she has been lying in bed for the last 4-5 days. She would really like to mobilize and felt this would help her mood.  Objective: Temp:  [97.7 F (36.5 C)-98 F (36.7 C)] 97.8 F (36.6 C) (07/27 9937) Pulse Rate:  [93-102] 93 (07/27 0614) Resp:  [18-20] 20 (07/27 0614) BP: (119-134)/(62-80) 134/62 (07/27 0614) SpO2:  [92 %-100 %] 92 % (07/27 0614) Weight:  [78.5 kg] 78.5 kg (07/27 0100)    General: Alert and cooperative and appears to be in no acute distress HEENT: Neck non-tender without lymphadenopathy, masses or thyromegaly Cardio: Normal S1 and S2, no S3 or S4. Rhythm is regular. No murmurs or rubs.   Pulm: Clear to auscultation bilaterally, no crackles, wheezing, or diminished breath sounds. Normal respiratory effort Abdomen: Bowel sounds normal. Abdomen soft, large mass noted in the lower abdomen.  Lower abdomen tender no guarding. Extremities: No peripheral edema. Warm/ well perfused.  Strong radial pulse Neuro: Cranial nerves grossly intact Psych: low mood low affect  Laboratory: Recent Labs  Lab 12/20/18 1115 12/21/18 0515 12/22/18 0548  WBC 14.5* 15.2* 11.3*  HGB 8.3* 7.4* 7.5*  HCT 29.4* 26.5* 26.3*  PLT 451* 447* 423*   Recent Labs  Lab 12/17/18 1233  12/19/18 0748 12/20/18 1115 12/21/18 0515 12/22/18 0548  NA 138   < > 137 138 136 134*  K 4.6   < > 3.1* 3.7 3.1* 3.5  CL 101   < > 106 108 108 108  CO2 20   < > 21* 20* 17* 17*  BUN 15   < > 11 9 8 8   CREATININE 0.68   < > 0.63 0.58 0.66 0.68  CALCIUM 9.0   < > 7.7* 7.7* 7.5* 7.4*  PROT 6.6  --  5.4*  --   --   --   BILITOT 0.5  --  0.9  --   --   --   ALKPHOS 137*  --  89  --   --   --   ALT 5  --  8  --   --   --   AST 17  --  15  --   --   --   GLUCOSE 94   < > 84 93 80 75   < > = values in this interval not displayed.      Imaging/Diagnostic Tests: No results found.  Lattie Haw, MD 12/23/2018, 6:28 AM PGY-3, [place] Intern pager: [phone], text  pages welcome

## 2018-12-23 NOTE — Progress Notes (Signed)
Overall, I would have to say that Sarah Moody is about the same.  She did get a dose of IV iron.  Her iron studies clearly show that she is iron deficient.  I really think she needs to be on therapeutic heparin for the PE.  The heparin at 5000 units 3 times daily is just not enough for her.  I think pharmacy probably needs to be involved.  I would think that she needs some type of infusion of heparin.  Her labs not back yet today.  She goes for her biopsy today.  Given her overall performance status, I just would be very surprised if we could treat her.  I do still think that she is in good enough shape to take chemotherapy.  I think the only exception that I can think of is if this is lymphoma.  Her vital signs were all stable.  Oxygen saturation is 92%.  Her temperature is 97.8.  Pulse is 93.  Blood pressure 134/62.  Her lungs sound pretty good bilaterally.  She has good air movement bilaterally.  Cardiac exam regular rate and rhythm with a normal S1-S2.  Abdomen is soft.  She has a fullness down in the lower abdomen from the uterine mass.  Extremities shows no clubbing, cyanosis or edema.  Neurological exam is nonfocal.  I probably would get Dopplers of her legs to see if she has lower extremity thromboembolic disease.  I suspect that she probably does.  She has a uterus that is enlarged, this might be pressing upon her iliac vessels.  I think for right now, the most important thing for her is to get her on therapeutic anticoagulation.  This can be done after she has her biopsy.  It probably will take 2 days at least until the biopsy comes back.  I know that she is getting wonderful care from the house staff up on [address].   Lattie Haw, MD  [place] 8:7

## 2018-12-23 NOTE — Progress Notes (Signed)
VASCULAR LAB PRELIMINARY  PRELIMINARY  PRELIMINARY  PRELIMINARY  Bilateral lower extremity venous duplex completed.    Preliminary report:  See CV proc for preliminary results.   Fredirick Maudlin, RN, results.   Tashika Goodin, RVT 12/23/2018, 6:38 PM

## 2018-12-24 ENCOUNTER — Inpatient Hospital Stay (HOSPITAL_COMMUNITY): Payer: Medicare Other

## 2018-12-24 LAB — PHOSPHORUS: Phosphorus: 2.8 mg/dL (ref 2.5–4.6)

## 2018-12-24 LAB — CBC
HCT: 29.3 % — ABNORMAL LOW (ref 36.0–46.0)
Hemoglobin: 7.8 g/dL — ABNORMAL LOW (ref 12.0–15.0)
MCH: 23.1 pg — ABNORMAL LOW (ref 26.0–34.0)
MCHC: 26.6 g/dL — ABNORMAL LOW (ref 30.0–36.0)
MCV: 86.7 fL (ref 80.0–100.0)
Platelets: 425 10*3/uL — ABNORMAL HIGH (ref 150–400)
RBC: 3.38 MIL/uL — ABNORMAL LOW (ref 3.87–5.11)
RDW: 22.1 % — ABNORMAL HIGH (ref 11.5–15.5)
WBC: 12.9 10*3/uL — ABNORMAL HIGH (ref 4.0–10.5)
nRBC: 0 % (ref 0.0–0.2)

## 2018-12-24 LAB — BASIC METABOLIC PANEL
Anion gap: 16 — ABNORMAL HIGH (ref 5–15)
BUN: 7 mg/dL — ABNORMAL LOW (ref 8–23)
CO2: 12 mmol/L — ABNORMAL LOW (ref 22–32)
Calcium: 7.7 mg/dL — ABNORMAL LOW (ref 8.9–10.3)
Chloride: 107 mmol/L (ref 98–111)
Creatinine, Ser: 0.68 mg/dL (ref 0.44–1.00)
GFR calc Af Amer: >60 mL/min (ref >60)
GFR calc non Af Amer: >60 mL/min (ref >60)
Glucose, Bld: 58 mg/dL — ABNORMAL LOW (ref 70–99)
Potassium: 3.4 mmol/L — ABNORMAL LOW (ref 3.5–5.1)
Sodium: 135 mmol/L (ref 135–145)

## 2018-12-24 LAB — MAGNESIUM: Magnesium: 1.6 mg/dL — ABNORMAL LOW (ref 1.7–2.4)

## 2018-12-24 MED ORDER — APIXABAN 5 MG PO TABS
5.0000 mg | ORAL_TABLET | Freq: Two times a day (BID) | ORAL | Status: DC
  Administered 2018-12-24 – 2018-12-26 (×5): 5 mg via ORAL
  Filled 2018-12-24 (×5): qty 1

## 2018-12-24 MED ORDER — MIDAZOLAM HCL 2 MG/2ML IJ SOLN
INTRAMUSCULAR | Status: AC
  Filled 2018-12-24: qty 2

## 2018-12-24 MED ORDER — POTASSIUM CHLORIDE CRYS ER 20 MEQ PO TBCR
40.0000 meq | EXTENDED_RELEASE_TABLET | Freq: Once | ORAL | Status: DC
  Filled 2018-12-24 (×2): qty 2

## 2018-12-24 MED ORDER — MAGNESIUM SULFATE 2 GM/50ML IV SOLN
2.0000 g | Freq: Once | INTRAVENOUS | Status: AC
  Administered 2018-12-24: 2 g via INTRAVENOUS
  Filled 2018-12-24: qty 50

## 2018-12-24 MED ORDER — LIDOCAINE HCL (PF) 1 % IJ SOLN
INTRAMUSCULAR | Status: AC
  Filled 2018-12-24: qty 30

## 2018-12-24 MED ORDER — FENTANYL CITRATE (PF) 100 MCG/2ML IJ SOLN
INTRAMUSCULAR | Status: AC | PRN
  Administered 2018-12-24: 50 ug via INTRAVENOUS
  Administered 2018-12-24 (×2): 25 ug via INTRAVENOUS

## 2018-12-24 MED ORDER — MIDAZOLAM HCL 2 MG/2ML IJ SOLN
INTRAMUSCULAR | Status: AC | PRN
  Administered 2018-12-24: 1 mg via INTRAVENOUS
  Administered 2018-12-24 (×2): 0.5 mg via INTRAVENOUS

## 2018-12-24 MED ORDER — FENTANYL CITRATE (PF) 100 MCG/2ML IJ SOLN
INTRAMUSCULAR | Status: AC
  Filled 2018-12-24: qty 2

## 2018-12-24 NOTE — Progress Notes (Signed)
SOCIAL VISIT NOTE I am the PCP for Sarah Moody making a social visit. Sarah Moody is less anxious today, per her report, than she was yesterday in the build up to her biopsy procedure.   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  She requests being moved out of the bed to chair once post-procedural activity restrictions allow it.  She does want to participate in rehabilitation therapies in order to allow her to participate in her own ADLs.   Sarah Suh and I appreciate the wonderful care she is receiving from the floor and house staff,  the care and expertise of Dr Marin Olp and his team and from the palliative care service.

## 2018-12-24 NOTE — Plan of Care (Signed)
  Problem: Nutrition: Goal: Adequate nutrition will be maintained Outcome: Progressing   

## 2018-12-24 NOTE — Progress Notes (Addendum)
Discussed discharge options with Ms. Cletus Gash. I explained options of skilled nursing facility versus inpatient rehab versus going home with home health.  Patient felt a little overwhelmed with these options however thought that inpatient rehab would be good for her.  I agreed with Ms. Gassner and thought it would be a good option for her because she would remain in the hospital which she is familiar with and Dr. McDiarmid and the oncology team could come and see her regularly who have been a large support system for her. However I said she doesn't have to make any decisions today and we can discuss it tomorrow on rounds when I see her.  She is pleased with her progress today with a physiotherapist.  I said for her to continue physio as long as she remains an inpatient on our service as it will help her regain the strength.   Lattie Haw, MD, PGY 1

## 2018-12-24 NOTE — Progress Notes (Signed)
Family Medicine Teaching Service Daily Progress Note Intern Pager: [phone]  Patient name: Sarah Moody Medical record number: [id number] Date of birth: 04/17/1946 Age: 73 y.o. Gender: female  Primary Care Provider: McDiarmid, Blane Ohara, MD Consultants: Heme/Onc Code Status:Full  Pt Overview and Major Events to Date:  07/22- Admit  Assessment and Plan:  Symptomatic Anemia-stable Denies feeling dizzy -Hb today 7.8, 7.6 on 7/27, increased from admission 6.9 now status post 1 unit RBC -Had IV fereheme 510mg  on 7/26 -Currently holding heparin for liver biopsy today.  Oncology recommended therapeutic heparin for DVT and PE after biopsy. -Continue N/S at 563OV/FI -Folic Acid 1mg  daily and multivitamins 1 tab daily unit  Likely Metastatic Ca  -Oncology consulted, appreciate recs       Recommended therapeutic heparin-infusion of heparin       Restart anticoagulation after biopsy       Lower limb Dopplers for DVT- acute DVT involving the right common femoral vein, right femoral       vein, right popliteal vein, right posterior tibial veins, right peroneal veins, and right gastrocnemius       veins. Left: DVT involving the left common femoral vein, left femoral vein, left popliteal vein, left          posterior tibial veins, and left peroneal veins. -IR consulted-appreciate recs, due to US guided liver biopsy of liver lesion/sub-q abdominal nodule  Weight Loss-chronic, weight charted unreliable -Nutritional appreciate recs- Boost Plus chocolate BID- Each supplement provides 360kcal and 14g protein.   Magic cup TID with meals, each supplement provides 290 kcal and 9 grams of protein -BMP each morning -Magnesium and phosphorus due today  Abdominal Pain-stable, only positional -Acetaminophen prn, continue to assess pain control -Protonix 40mg  BID -Tums daily  Leukocytosis WCB 12.9 today, 11.8 on 7/27, 15 on admission -CBC in am  Hypomagnesemia Mg 1.6 today Mg 1.4 on 7/27 -Continue  to monitor replete as necessary  Hypokalemia K 3.4 today, 3.4 on 7/27 -Continue to replete with oral potassium tablets -Continue To monitor  FEN/GI: Regular diet, Protonix PPx: SCD's  Disposition: pending biopsy of liver nodule for abdominal nodule  Subjective:  Patient feels low in mood.  "I would like everything to stop working and fed up of being in hospital".  Concerned for when she goes home would like to cook for herself be more independent.  Patient feels physically stronger than on admission.  Denies chest pain, dyspnea, fevers or vomiting.  Concerns about family history of cancer and would like her sisters to be screened.  I explained that we do not know the biopsy results but unable to recommend medical advice to family members.  I explained we should wait until the results are back.   Objective: Temp:  [97.7 F (36.5 C)-98.3 F (36.8 C)] 97.7 F (36.5 C) (07/28 0432) Pulse Rate:  [97-104] 99 (07/28 0432) Resp:  [16-18] 18 (07/28 0432) BP: (98-135)/(66-76) 130/67 (07/28 0432) SpO2:  [98 %-100 %] 100 % (07/28 0432) Weight:  [78.2 kg] 78.2 kg (07/27 2038)    General: Alert and cooperative and appears to be in no acute distress HEENT: Neck non-tender without lymphadenopathy, masses or thyromegaly Cardio: Normal S1 and S2, no S3 or S4. Rhythm is regular. No murmurs or rubs.   Pulm: Clear to auscultation bilaterally, no crackles, wheezing, or diminished breath sounds. Normal respiratory effort Abdomen: Bowel sounds normal. Abdomen soft, fullness and large mass noted in the lower abdomen.  Lower abdomen tender no guarding. Extremities: No peripheral  edema. Warm/ well perfused.  Weak radial pulse. Neuro: Cranial nerves grossly intact Psych: low mood low affect  Laboratory: Recent Labs  Lab 12/22/18 0548 12/23/18 0707 12/24/18 0528  WBC 11.3* 11.8* 12.9*  HGB 7.5* 7.6* 7.8*  HCT 26.3* 27.3* 29.3*  PLT 423* 506* 425*   Recent Labs  Lab 12/17/18 1233  12/19/18 0748   12/22/18 0548 12/23/18 0707 12/24/18 0528  NA 138   < > 137   < > 134* 135 135  K 4.6   < > 3.1*   < > 3.5 3.4* 3.4*  CL 101   < > 106   < > 108 107 107  CO2 20   < > 21*   < > 17* 17* 12*  BUN 15   < > 11   < > 8 8 7*  CREATININE 0.68   < > 0.63   < > 0.68 0.62 0.68  CALCIUM 9.0   < > 7.7*   < > 7.4* 7.5* 7.7*  PROT 6.6  --  5.4*  --   --   --   --   BILITOT 0.5  --  0.9  --   --   --   --   ALKPHOS 137*  --  89  --   --   --   --   ALT 5  --  8  --   --   --   --   AST 17  --  15  --   --   --   --   GLUCOSE 94   < > 84   < > 75 69* 58*   < > = values in this interval not displayed.      Imaging/Diagnostic Tests: Vas [place] Lower Extremity Venous (dvt)  Result Date: 12/23/2018  Lower Venous Study Indications: Swelling, and pulmonary embolism.  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 Performing Technologist: Sharion Dove RVS  Examination Guidelines: A complete evaluation includes B-mode imaging, spectral Doppler, color Doppler, and power Doppler as needed of all accessible portions of each vessel. Bilateral testing is considered an integral part of a complete examination. Limited examinations for reoccurring indications may be performed as noted.  +---------+---------------+---------+-----------+----------+-------+ RIGHT    CompressibilityPhasicitySpontaneityPropertiesSummary +---------+---------------+---------+-----------+----------+-------+ CFV      Partial        Yes      Yes                          +---------+---------------+---------+-----------+----------+-------+ SFJ      Partial                                              +---------+---------------+---------+-----------+----------+-------+ FV Prox  Partial                                              +---------+---------------+---------+-----------+----------+-------+ FV Mid   Full                                                  +---------+---------------+---------+-----------+----------+-------+  FV DistalFull                                                 +---------+---------------+---------+-----------+----------+-------+ PFV      Full                                                 +---------+---------------+---------+-----------+----------+-------+ POP      None           No       No                           +---------+---------------+---------+-----------+----------+-------+ PTV      None                                                 +---------+---------------+---------+-----------+----------+-------+ PERO     None                                                 +---------+---------------+---------+-----------+----------+-------+ Gastroc  None                                                 +---------+---------------+---------+-----------+----------+-------+   +---------+---------------+---------+-----------+----------+-------+ LEFT     CompressibilityPhasicitySpontaneityPropertiesSummary +---------+---------------+---------+-----------+----------+-------+ CFV      Partial        Yes      Yes                          +---------+---------------+---------+-----------+----------+-------+ SFJ      Full                                                 +---------+---------------+---------+-----------+----------+-------+ FV Prox  None                                                 +---------+---------------+---------+-----------+----------+-------+ FV Mid   None                                                 +---------+---------------+---------+-----------+----------+-------+ FV DistalNone                                                 +---------+---------------+---------+-----------+----------+-------+ PFV      None                                                  +---------+---------------+---------+-----------+----------+-------+  POP      None           No       No                           +---------+---------------+---------+-----------+----------+-------+ PTV      None                                                 +---------+---------------+---------+-----------+----------+-------+ PERO     None                                                 +---------+---------------+---------+-----------+----------+-------+     Summary: Right: Findings consistent with acute deep vein thrombosis involving the right common femoral vein, right femoral vein, right popliteal vein, right posterior tibial veins, right peroneal veins, and right gastrocnemius veins. A cystic structure is found in the popliteal fossa. Left: Findings consistent with acute deep vein thrombosis involving the left common femoral vein, left femoral vein, left popliteal vein, left posterior tibial veins, and left peroneal veins. A cystic structure is found in the popliteal fossa.  *See table(s) above for measurements and observations.    Preliminary     Lattie Haw, MD 12/24/2018, 6:50 AM PGY-3, [place] [facility] Intern pager: [phone], text pages welcome

## 2018-12-24 NOTE — Progress Notes (Signed)
  Patient has had biopsy today of subcutaneous nodule.  Dr. Ouida Sills and I went to discuss discharge options with [name].  We explained that the biopsy results will not be back for a few days and that she could either stay in the hospital or go home if she felt strong enough to.  Patient said she feels too weak to go home and would like further PT sessions to build her strength.  She would like to discuss this further with Dr. McDiarmid tomorrow.  Towards the end of this discussion the physiotherapist came in to see [name].  I will follow to see how she does with PT today.  Also discussed starting anticoagulation for her PE and DVTs.  We explained the bleeding risks of this medication.  He is happy to start Eliquis today as recommended by Dr. Marin Olp.  Lattie Haw MD PGY1

## 2018-12-24 NOTE — Plan of Care (Signed)
  Problem: Education: Goal: Knowledge of General Education information will improve Description Including pain rating scale, medication(s)/side effects and non-pharmacologic comfort measures Outcome: Progressing   

## 2018-12-24 NOTE — Evaluation (Signed)
Physical Therapy Evaluation Patient Details Name: Sarah Moody MRN: [id number] DOB: May 24, 1946 Today's Date: 12/24/2018   History of Present Illness  73 y.o. female  with past medical history of hypertension, anemia admitted on 12/18/2018 with increased weakness, decreased oral intake, nausea and vomiting and shortness of breath.  Patient was also reporting a 50 pound weight loss over 4 months.  Upon admission she was found to have a hemoglobin of 6.9.  Clinical Impression   Pt admitted with above diagnosis. Pt currently with functional limitations due to the deficits listed below (see PT Problem List). Sarah Moody was independent managing at home prior to the few days leading up to this admission; She has assist from her sister and son as well; Presenting to PT today with generalized weakness and functional dependencies with gait and transfers; She had a lot on her mind after talking with Drs. Posey Pronto and Ouida Sills -- but she clearly tells me she hopes to get strong enough to be able to manage at home. Pt will benefit from skilled PT to increase their independence and safety with mobility to allow discharge to the venue listed below.   Given her diagnosis, it is unconventional to consider CIR for intensive post-acute therapies -- BUT she is so motivated to get stronger to go home, I believe CIR is worth consideration -- Still, I recognize an intensive rehab stay may be at odds with hospice care, and I will of course consider Sarah Moody's whole situation; will ask Rehab admissions Coordinator to weigh in.  At her current functional level, home without 24 hour reliable assistance is not a viable option -- we must consider some form of post-acute rehab to maximize independence and safety with mobility and ADLs;   It is also possible (likely?) that Sarah Moody's overall status will imporve here in the short-term; will monitor progress with the goal of helpingher get home; I wonder if among her sister, son,  and home health therapies and services, we can get reliable 24 hour assistance arranged.     Follow Up Recommendations CIR(See discussion above)    Equipment Recommendations  Rolling walker with 5" wheels;3in1 (PT);Other (comment)(wide, for better fit)  Bari wheelchair, consider hospital bed (she likely already has some equipment)   Recommendations for Other Services Rehab consult(Unconventional given diagnosis, but she's very motiviated)     Precautions / Restrictions Precautions Precautions: Fall      Mobility  Bed Mobility Overal bed mobility: Needs Assistance Bed Mobility: Supine to Sit     Supine to sit: Min guard     General bed mobility comments: Very slow moving and minguard for safety; used bed rails, but di dnot require physical assist  Transfers Overall transfer level: Needs assistance Equipment used: 1 person hand held assist;Rolling walker (2 wheeled) Transfers: Risk manager;Sit to/from Stand Sit to Stand: Min assist Stand pivot transfers: Mod assist       General transfer comment: Moderate assist to perform basic stand pivot bed to [facility]; Difficlty stepping feet first time up and OOB in a while, and at one point, her center of mass was too far in front of her feet -- heavy moderate assist and heavy dependence on using her arms to reach for [facility] armrest and ease into sitting; Stood from [facility] with good push from armrests and came straight sit to stand with min assist to steady; then BSC removed and recliner moved to right underneath her  Ambulation/Gait  Stairs            Wheelchair Mobility    Modified Rankin (Stroke Patients Only)       Balance Overall balance assessment: Needs assistance           Standing balance-Leahy Scale: Poor                               Pertinent Vitals/Pain Pain Assessment: No/denies pain(Just LEs feel weak)    Home Living Family/patient expects to be discharged to::  Private residence Living Arrangements: Alone Available Help at Discharge: Family;Available PRN/intermittently(Sister lives close and son assists as well) Type of Home: House Home Access: Stairs to enter   Technical brewer of Steps: 1 Home Layout: One level Home Equipment: Shower seat(RW-- unsure if 2 wheels or 4 wheels)      Prior Function Level of Independence: Independent with assistive device(s)         Comments: RW prn; was managing independently (except for IADLs) until a few days before this admission     Hand Dominance        Extremity/Trunk Assessment   Upper Extremity Assessment Upper Extremity Assessment: Defer to OT evaluation;Generalized weakness    Lower Extremity Assessment Lower Extremity Assessment: Generalized weakness(Body habitus limiting bil hip ROM)       Communication   Communication: No difficulties  Cognition Arousal/Alertness: Awake/alert Behavior During Therapy: WFL for tasks assessed/performed Overall Cognitive Status: Within Functional Limits for tasks assessed                                        General Comments General comments (skin integrity, edema, etc.): Sarah Baas, RN assisted with cleanup and hygiene as well as safety getting form BSC to recliner    Exercises     Assessment/Plan    PT Assessment Patient needs continued PT services  PT Problem List Decreased strength;Decreased range of motion;Decreased activity tolerance;Decreased balance;Decreased mobility;Decreased coordination;Decreased knowledge of use of DME;Decreased safety awareness;Decreased knowledge of precautions;Pain       PT Treatment Interventions DME instruction;Gait training;Stair training;Functional mobility training;Therapeutic activities;Therapeutic exercise;Balance training;Patient/family education    PT Goals (Current goals can be found in the Care Plan section)  Acute Rehab PT Goals Patient Stated Goal: She is very motivated to  work with therapies to get stronger to get home PT Goal Formulation: With patient Time For Goal Achievement:  Potential to Achieve Goals: Fair    Frequency Min 3X/week   Barriers to discharge Decreased caregiver support;Other (comment) Among sister, so, and possible HH caregivers, can 24 hour assist be arranged to help get Ms. L[place] home?    Co-evaluation               AM-PAC PT "6 Clicks" Mobility  Outcome Measure Help needed turning from your back to your side while in a flat bed without using bedrails?: A Little Help needed moving from lying on your back to sitting on the side of a flat bed without using bedrails?: A Little Help needed moving to and from a bed to a chair (including a wheelchair)?: A Lot Help needed standing up from a chair using your arms (e.g., wheelchair or bedside chair)?: A Lot Help needed to walk in hospital room?: A Lot Help needed climbing 3-5 steps with a railing? : Total 6 Click Score: 13  End of Session Equipment Utilized During Treatment: Gait belt Activity Tolerance: Patient limited by fatigue Patient left: in chair;with call bell/phone within reach;with chair alarm set Nurse Communication: Mobility status PT Visit Diagnosis: Unsteadiness on feet (R26.81);Other abnormalities of gait and mobility (R26.89);Muscle weakness (generalized) (M62.81)    Time: 6440-3474 PT Time Calculation (min) (ACUTE ONLY): 34 min   Charges:   PT Evaluation $PT Eval Moderate Complexity: 1 Mod PT Treatments $Therapeutic Activity: 8-22 mins        Roney Marion, PT  Acute Rehabilitation Services Pager [phone] Office [phone]   Colletta [place] 12/24/2018, 2:43 PM

## 2018-12-24 NOTE — Progress Notes (Signed)
Occupational Therapy Evaluation Patient Details Name: Sarah Moody MRN: [id number] DOB: 1945-07-09 Today's Date: 12/24/2018    History of Present Illness 73 y.o. female  with past medical history of hypertension, anemia admitted on 12/18/2018 with increased weakness, decreased oral intake, nausea and vomiting and shortness of breath.  Patient was also reporting a 50 pound weight loss over 4 months.  Upon admission she was found to have a hemoglobin of 6.9.   Clinical Impression   Pt presents with above diagnosis. PTA pt PLOF independent until recently requiring assist with ADLs within the past 3-4 months while living home alone. Pt currently requires assists with ADLs for set up and safety due to decreased strength and impaired balance. Pt is highly motivated to receive the therapy needed to regain her function for ADLs. Pt will benefit from continued acute to address strength, safe ADL engagement, and functional transfer prior to dc to SNF level.    Follow Up Recommendations  SNF;Supervision/Assistance - 24 hour    Equipment Recommendations  3 in 1 bedside commode    Recommendations for Other Services       Precautions / Restrictions Precautions Precautions: Fall Restrictions Weight Bearing Restrictions: No      Mobility Bed Mobility Overal bed mobility: Needs Assistance Bed Mobility: Sit to Supine     Supine to sit: Min guard Sit to supine: Max assist   General bed mobility comments: Pt received in chair upon arrival and transfered to bed required Max A to manage BLE.   Transfers Overall transfer level: Needs assistance Equipment used: Rolling walker (2 wheeled);2 person hand held assist Transfers: Stand Pivot Transfers;Sit to/from Stand Sit to Stand: Min assist Stand pivot transfers: Mod assist       General transfer comment: Min A for sit to stand for physical assistance.     Balance Overall balance assessment: Needs assistance           Standing  balance-Leahy Scale: Poor                             ADL either performed or assessed with clinical judgement   ADL Overall ADL's : Needs assistance/impaired Eating/Feeding: Set up;Sitting   Grooming: Set up;Sitting   Upper Body Bathing: Modified independent;Sitting   Lower Body Bathing: Supervison/ safety;Sitting/lateral leans   Upper Body Dressing : Modified independent;Sitting   Lower Body Dressing: Modified independent;Bed level   Toilet Transfer: Moderate assistance;+2 for physical assistance;Stand-pivot;RW Toilet Transfer Details (indicate cue type and reason): simulated toilet transfer from chair to bed with RW. Pt attempted to power up to stand with no assistance, however, due to low strength unsuccessful required assistance for 2nd attempt. VCs for sequencing and safety.          Functional mobility during ADLs: Moderate assistance;+2 for physical assistance;Rolling walker General ADL Comments: Pt is fearful with functional transfers and requires assistance for safety.     Vision         Perception     Praxis      Pertinent Vitals/Pain Pain Assessment: No/denies pain     Hand Dominance     Extremity/Trunk Assessment Upper Extremity Assessment Upper Extremity Assessment: Generalized weakness   Lower Extremity Assessment Lower Extremity Assessment: Defer to PT evaluation       Communication Communication Communication: No difficulties   Cognition Arousal/Alertness: Awake/alert Behavior During Therapy: WFL for tasks assessed/performed Overall Cognitive Status: Within Functional Limits for tasks assessed  General Comments  [name], RN assisted with cleanup and hygiene as well as safety getting form BSC to recliner    Exercises     Shoulder Instructions      Home Living Family/patient expects to be discharged to:: Private residence Living Arrangements: Alone Available Help at  Discharge: Family;Available PRN/intermittently(Sister lives close and son assists as well) Type of Home: House Home Access: Stairs to enter Technical brewer of Steps: 1   Home Layout: One level     Bathroom Shower/Tub: [place]: Clinical cytogeneticist - 2 wheels(RW-- unsure if 2 wheels or 4 wheels)   Additional Comments: Reports needing assistance with ADLs for the past 3-4 months.      Prior Functioning/Environment Level of Independence: Independent with assistive device(s)        Comments: RW prn; was managing independently (except for IADLs) until a few days before this admission        OT Problem List: Decreased strength;Decreased activity tolerance;Impaired balance (sitting and/or standing);Decreased safety awareness;Decreased knowledge of use of DME or AE;Decreased knowledge of precautions      OT Treatment/Interventions: Self-care/ADL training;Therapeutic exercise;DME and/or AE instruction;Therapeutic activities;Patient/family education;Balance training    OT Goals(Current goals can be found in the care plan section) Acute Rehab OT Goals Patient Stated Goal: She is very motivated to work with therapies to get stronger to get home OT Goal Formulation: With patient Time For Goal Achievement:  Potential to Achieve Goals: Good  OT Frequency: Min 1X/week   Barriers to D/C: Decreased caregiver support  Pt will required 24 hour support prior to returning to home setting.        Co-evaluation              AM-PAC OT "6 Clicks" Daily Activity     Outcome Measure Help from another person eating meals?: A Little Help from another person taking care of personal grooming?: A Little Help from another person toileting, which includes using toliet, bedpan, or urinal?: A Lot Help from another person bathing (including washing, rinsing, drying)?: A Little Help from another person to put on and taking off regular upper body  clothing?: A Little Help from another person to put on and taking off regular lower body clothing?: A Little 6 Click Score: 17   End of Session Equipment Utilized During Treatment: Gait belt;Rolling walker Nurse Communication: Mobility status  Activity Tolerance: Patient tolerated treatment well Patient left: in bed;with call bell/phone within reach;with nursing/sitter in room  OT Visit Diagnosis: Unsteadiness on feet (R26.81);Repeated falls (R29.6);Muscle weakness (generalized) (M62.81)                Time: 9678-9381 OT Time Calculation (min): 20 min Charges:  OT General Charges $OT Visit: 1 Visit OT Evaluation $OT Eval Low Complexity: [place], MSOT, OTR/L  Supplemental Rehabilitation Services  [phone]   Marius Ditch 12/24/2018, 3:36 PM

## 2018-12-24 NOTE — Procedures (Signed)
Interventional Radiology Procedure:   Indications: Metastatic disease  Procedure: US guided subcutaneous nodule biopsy  Findings: Irregular periumbilical subcutaneous nodule.  Multiple core biopsies obtained.   Complications: None     EBL: less than 10 ml  Plan: Bedrest 1 hour, may start heparin in 2 hours if desired by primary service.     Joua Bake R. Anselm Pancoast, MD  Pager: [phone]

## 2018-12-24 NOTE — Progress Notes (Addendum)
Telephone call to Dr. Alfonzo Beers oncologist.  Many thanks for your input.  Dr. Marin Olp recommended Eliquis 5 mg twice daily for anticoagulation for PE and DVT.  If she was to stay in hospital she could remain on therapeutic heparin.  Lattie Haw PGY-1

## 2018-12-24 NOTE — Progress Notes (Signed)
Rehab Admissions Coordinator Note:  Patient was screened by Cleatrice Burke for appropriateness for an Inpatient Acute Rehab Consult per PT recs.  At this time, we are recommending Inpatient Rehab consult if pt would like to be assessed for potential admit by our team. Please advise.  Cleatrice Burke RN MSN 12/24/2018, 4:00 PM  I can be reached at [phone].

## 2018-12-24 NOTE — Progress Notes (Signed)
OT Cancellation Note  Patient Details Name: Sarah Moody MRN: [id number] DOB: Apr 25, 1946   Cancelled Treatment:    Reason Eval/Treat Not Completed: Patient at procedure or test/ unavailable. OT will follow for eval as time allows.   Thank you,  Minus Breeding, MSOT, OTR/L  Supplemental Rehabilitation Services  [phone]   Marius Ditch 12/24/2018, 11:06 AM

## 2018-12-25 LAB — CBC
HCT: 25.8 % — ABNORMAL LOW (ref 36.0–46.0)
Hemoglobin: 7.1 g/dL — ABNORMAL LOW (ref 12.0–15.0)
MCH: 22.8 pg — ABNORMAL LOW (ref 26.0–34.0)
MCHC: 27.5 g/dL — ABNORMAL LOW (ref 30.0–36.0)
MCV: 82.7 fL (ref 80.0–100.0)
Platelets: 497 10*3/uL — ABNORMAL HIGH (ref 150–400)
RBC: 3.12 MIL/uL — ABNORMAL LOW (ref 3.87–5.11)
RDW: 21.7 % — ABNORMAL HIGH (ref 11.5–15.5)
WBC: 12.8 10*3/uL — ABNORMAL HIGH (ref 4.0–10.5)
nRBC: 0 % (ref 0.0–0.2)

## 2018-12-25 LAB — BASIC METABOLIC PANEL
Anion gap: 8 (ref 5–15)
BUN: 8 mg/dL (ref 8–23)
CO2: 17 mmol/L — ABNORMAL LOW (ref 22–32)
Calcium: 7.6 mg/dL — ABNORMAL LOW (ref 8.9–10.3)
Chloride: 111 mmol/L (ref 98–111)
Creatinine, Ser: 0.56 mg/dL (ref 0.44–1.00)
GFR calc Af Amer: >60 mL/min (ref >60)
GFR calc non Af Amer: >60 mL/min (ref >60)
Glucose, Bld: 93 mg/dL (ref 70–99)
Potassium: 4 mmol/L (ref 3.5–5.1)
Sodium: 136 mmol/L (ref 135–145)

## 2018-12-25 LAB — MAGNESIUM: Magnesium: 1.6 mg/dL — ABNORMAL LOW (ref 1.7–2.4)

## 2018-12-25 LAB — PREPARE RBC (CROSSMATCH)

## 2018-12-25 MED ORDER — SODIUM CHLORIDE 0.9% IV SOLUTION
Freq: Once | INTRAVENOUS | Status: AC
  Administered 2018-12-26: via INTRAVENOUS

## 2018-12-25 MED ORDER — ONDANSETRON HCL 4 MG/2ML IJ SOLN
4.0000 mg | Freq: Once | INTRAMUSCULAR | Status: AC
  Administered 2018-12-25: 4 mg via INTRAVENOUS
  Filled 2018-12-25: qty 2

## 2018-12-25 MED ORDER — MAGNESIUM SULFATE 2 GM/50ML IV SOLN
2.0000 g | Freq: Once | INTRAVENOUS | Status: AC
  Administered 2018-12-25: 2 g via INTRAVENOUS
  Filled 2018-12-25: qty 50

## 2018-12-25 NOTE — Progress Notes (Signed)
Nutrition Brief Note  Chart reviewed. Pt now transitioning to comfort measures.  No further nutrition interventions warranted at this time.  Please re-consult as needed.   Mariana Single RD, LDN Clinical Nutrition Pager # [phone]

## 2018-12-25 NOTE — Progress Notes (Signed)
     FAMILY MEDICINE INTERN PAGER IS NOT WORKING. PLEASE PAGE:  DAY TEAM UNTIL 7PM: DR Rima Blizzard, DAY INTERN- [phone]  NIGHT TEAM 7PM-7AM DR WALSH-NIGHT INTERN- [phone] DR BEARD- NIGHT UPPER LEVEL-[phone]   THANK YOU.  WE APOLOGIZE FOR THE INCONVENIENCE 

## 2018-12-25 NOTE — Progress Notes (Signed)
Family Medicine Teaching Service Daily Progress Note Intern Pager: [phone]  Patient name: Sarah Moody Medical record number: [id number] Date of birth: 06-08-1945 Age: 73 y.o. Gender: female  Primary Care Provider: McDiarmid, Blane Ohara, MD Consultants: Heme/Onc Code Status: DNR  Pt Overview and Major Events to Date:  Sarah Moody is a 73 y.o. female presenting with increasing weakness and fatigue . PMH is significant for HTN, Anemia and likely metastatic cancer.  Assessment and Plan:  Symptomatic Anemia-stable Asymptomatic -Hb today 7.1, 7.8 on 7/28, 7.6 on 7/27, increased from admission 6.9 now status post 1 unit RBC -Had IV fereheme 510mg  on 7/26 -Continue N/S at 737TG/GY -Folic Acid 1mg  daily and multivitamins 1 tab daily  Likely Metastatic Ca  -Patient had biopsy of subcutaneous nodule on 7/28, awaiting biopsy results -Oncology consulted, appreciate recs      -Dr. Marin Olp feels that best option for patient is going to hospice-[facility].  Unable to make referral until               biopsy results are back.  He will kindly call [facility] after results.      -Following a discussion with her she is now DNR      -Approach should be comfort measures -Started on Eliquis 5 mg twice daily on 7/27 as per oncology        Weight Loss-chronic, weight charted unreliable -Nutritional appreciate recs- Boost Plus chocolate BID- Each supplement provides 360kcal and 14g protein.   Magic cup TID with meals, each supplement provides 290 kcal and 9 grams of protein -BMP each morning -Magnesium and phosphorus due today  Abdominal Pain-stable, only positional -Acetaminophen prn, continue to assess pain control -Protonix 40mg  BID -Tums daily  Leukocytosis-stable WCB 12.8 today, 12.9 on 7/2 8, 15 on admission -CBC in am  Hypomagnesemia Mg 1.6 today  Mg 1.6 on 7/28  -Continue to monitor replete as necessary  Hypokalemia K 4 today, 3.4 on 7/28 -Continue to replete with oral  potassium tablets -Continue To monitor  FEN/GI: Regular diet, Protonix PPx: SCD's  Disposition: Possibly hospice after pathology results are back  Subjective:   Reported talk with Dr. Marin Olp morning who reiterated poor prognosis.  Patient unsure about discharge options after yesterday's discussions with me.  Unsure she she will cope with CIR. She burst into tears and said she would like to go home but is not strong enough.  She feels very confused and overwhelmed with the situation.  Would like to continue with physio-as an inpatient increase her strength.  Denies chest pain, shortness of breath, vomiting or fevers.  Objective: Temp:  [97.5 F (36.4 C)-98.2 F (36.8 C)] 97.6 F (36.4 C) (07/29 0549) Pulse Rate:  [97-118] 97 (07/29 0549) Resp:  [11-19] 18 (07/29 0549) BP: (107-148)/(64-94) 107/64 (07/29 0549) SpO2:  [99 %-100 %] 100 % (07/29 0549) Weight:  [78.2 kg] 78.2 kg (07/28 2034)    General: Alert, fearful, cooperative and appears to be in no acute distress HEENT: Neck non-tender without lymphadenopathy, masses or thyromegaly Cardio: Normal S1 and S2, no S3 or S4. Rhythm is regular. No murmurs or rubs.   Pulm: Clear to auscultation bilaterally, no crackles, wheezing, or diminished breath sounds. Normal respiratory effort Abdomen: Bowel sounds normal. Abdomen soft, abdominal mass noted in lower abdomen, generalized tenderness on palpation.  No guarding Extremities: No peripheral edema. Warm/ well perfused.  Weak radial pulse. Neuro: Cranial nerves grossly intact Psych: Low mood, low affect  Laboratory: Recent Labs  Lab  12/23/18 0707 12/24/18 0528 12/25/18 0543  WBC 11.8* 12.9* 12.8*  HGB 7.6* 7.8* 7.1*  HCT 27.3* 29.3* 25.8*  PLT 506* 425* 497*   Recent Labs  Lab 12/19/18 0748  12/22/18 0548 12/23/18 0707 12/24/18 0528  NA 137   < > 134* 135 135  K 3.1*   < > 3.5 3.4* 3.4*  CL 106   < > 108 107 107  CO2 21*   < > 17* 17* 12*  BUN 11   < > 8 8 7*   CREATININE 0.63   < > 0.68 0.62 0.68  CALCIUM 7.7*   < > 7.4* 7.5* 7.7*  PROT 5.4*  --   --   --   --   BILITOT 0.9  --   --   --   --   ALKPHOS 89  --   --   --   --   ALT 8  --   --   --   --   AST 15  --   --   --   --   GLUCOSE 84   < > 75 69* 58*   < > = values in this interval not displayed.      Imaging/Diagnostic Tests: [place] Core Biopsy (soft Tissue)  Result Date: 12/24/2018 INDICATION: 73 year old with presumed metastatic disease. Patient has disease in the abdomen and pelvis along with suspicious subcutaneous nodules. Tissue diagnosis is needed. EXAM: ULTRASOUND-GUIDED CORE BIOPSY OF SUBCUTANEOUS NODULE MEDICATIONS: None. ANESTHESIA/SEDATION: Moderate (conscious) sedation was employed during this procedure. A total of Versed 2.0 mg and Fentanyl 100 mcg was administered intravenously. Moderate Sedation Time: 15 minutes. The patient's level of consciousness and vital signs were monitored continuously by radiology nursing throughout the procedure under my direct supervision. FLUOROSCOPY TIME:  Fluoroscopy Time: None COMPLICATIONS: None immediate. PROCEDURE: Informed written consent was obtained from the patient after a thorough discussion of the procedural risks, benefits and alternatives. All questions were addressed. A timeout was performed prior to the initiation of the procedure. Ultrasound demonstrated a suspicious nodule in the right periumbilical subcutaneous tissue. The skin was prepped with chlorhexidine and sterile field was created. Skin and soft tissues were anesthetized with 1% lidocaine. 18 gauge core needle was directed into the lesion with ultrasound guidance. Core biopsies were obtained and placed in formalin. A total of 6 core biopsies were obtained with an 18 gauge device. Bandage placed over the puncture site. FINDINGS: Lobulated hypoechoic subcutaneous nodule in the right periumbilical region. No significant bleeding or hematoma formation following the core biopsies.  IMPRESSION: Ultrasound-guided core biopsies of the periumbilical subcutaneous nodule. Electronically Signed   By: Markus Daft M.D.   On: 12/24/2018 13:09    Lattie Haw, MD 12/25/2018, 6:14 AM PGY-1, [place] Intern pager: [phone], text pages welcome

## 2018-12-25 NOTE — Progress Notes (Signed)
Physical Therapy Treatment Patient Details Name: Sarah Moody MRN: [id number] DOB: 10-16-1945 Today's Date: 12/25/2018    History of Present Illness 73 y.o. female  with past medical history of hypertension, anemia admitted on 12/18/2018 with increased weakness, decreased oral intake, nausea and vomiting and shortness of breath.  Patient was also reporting a 50 pound weight loss over 4 months.  Upon admission she was found to have a hemoglobin of 6.9.    PT Comments    Continuing work on functional mobility and activity tolerance, at Ms. Pounds's request; She was able to stand at [facility] with 2 person assist, and take a few steps with the RW -- this seemed to please her; I have noted that we are shifting focus of her Goals of Care toward comfort and away from rehabilitation efforts; Functional mobility can contribute to her comfort; Will of course take the medical team's lead re: PT's role in her care; at this point, she indicated she is enjoying working on moving better, it sounds like she would like to work on independence with mobility while she can; Acute PT to follow as long as we are congruent with Ms. Nierman's goals.  Follow Up Recommendations  Other (comment)(Dependent upon medical course and goals of care)     Equipment Recommendations  Rolling walker with 5" wheels;3in1 (PT);Other (comment)(wide, for better fit)    Recommendations for Other Services       Precautions / Restrictions Precautions Precautions: Fall    Mobility  Bed Mobility Overal bed mobility: Needs Assistance Bed Mobility: Supine to Sit     Supine to sit: Min guard Sit to supine: Mod assist   General bed mobility comments: Slow moving to get EOB, but no phsyical assist needed; Mod assist to help LEs back in teh bed  Transfers Overall transfer level: Needs assistance Equipment used: Rolling walker (2 wheeled);2 person hand held assist Transfers: Sit to/from Stand Sit to Stand: Mod assist          General transfer comment: Mod assist of 2 for sit to stand from bed (no armrests to push up on); cues to stand fully upright, and fully extend hips in standing; mod assist to control descent to sit back to bed  Ambulation/Gait Ambulation/Gait assistance: Min assist;+2 safety/equipment Gait Distance (Feet): (sidesteps to [facility]) Assistive device: Rolling walker (2 wheeled)       General Gait Details: took 2 steps, short small sidestep to [facility], then fatigues and sat down to bed   Stairs             Wheelchair Mobility    Modified Rankin (Stroke Patients Only)       Balance             Standing balance-Leahy Scale: Poor                              Cognition   Behavior During Therapy: WFL for tasks assessed/performed Overall Cognitive Status: Within Functional Limits for tasks assessed                                        Exercises      General Comments        Pertinent Vitals/Pain Pain Assessment: No/denies pain    Home Living  Prior Function            PT Goals (current goals can now be found in the care plan section) Acute Rehab PT Goals Patient Stated Goal: Tells me she enjoys moving, and wants to continue PT    Frequency    Min 3X/week      PT Plan Discharge plan needs to be updated    Co-evaluation              AM-PAC PT "6 Clicks" Mobility   Outcome Measure                   End of Session Equipment Utilized During Treatment: Gait belt Activity Tolerance: Patient limited by fatigue Patient left: in bed;with call bell/phone within reach Nurse Communication: Mobility status PT Visit Diagnosis: Unsteadiness on feet (R26.81);Other abnormalities of gait and mobility (R26.89);Muscle weakness (generalized) (M62.81)     Time: 4315-4008 PT Time Calculation (min) (ACUTE ONLY): 32 min  Charges:  $Therapeutic Activity: 23-37 mins                     Roney Marion, PT  Acute Rehabilitation Services Pager [phone] Office [place] 12/25/2018, 4:01 PM

## 2018-12-25 NOTE — Plan of Care (Signed)
  Problem: Safety: Goal: Ability to remain free from injury will improve Outcome: Progressing   

## 2018-12-25 NOTE — Plan of Care (Signed)
  Problem: Nutrition: Goal: Adequate nutrition will be maintained Outcome: Progressing   Problem: Coping: Goal: Level of anxiety will decrease Outcome: Progressing   

## 2018-12-25 NOTE — Progress Notes (Addendum)
The biopsy results are not back yet.  I would hope that they will be back sometime today.  I did start having the conversation with her regarding end-of-life issues.  I have to believe that this malignancy is going to be one that we are not going to be able to treat.  She is not surprised by this at all.  I told her that I just do not think that she had the strength to be able to handle chemotherapy.  She asked me how long I thought that she had.  I really told her that I would be surprised if she made it through Labor Day weekend.  Again, I do not think she was all that surprised.  She is just so weakened by this malignancy.  I just do not see that she is a good candidate for any kind of rehab.  I believe that the best option for her is going to be hospice.  I think she would be a very good candidate for [facility].  I probably would not be able to make a referral until I know exactly what kind of malignancy we are looking at.  Her appetite is not that great.  This is no surprise.  She is on blood thinner.  Her hemoglobin is 7.1.  I probably would not transfuse her.  We talked about her wishes to be kept alive on machines.  I told her that if she were to be put on a machine, to help keep her alive, that she would never come off it because she is not strong enough.  She understands this.  She does not want to have her life prolonged by heroic measures.  As such, she is a DO NOT RESUSCITATE.  She is worried about her son.  She does not think that he will be able to handle the news all that well.  I told her that once we get the pathology report back, that I can call him and talk to him.  We are clearly dealing with quality of life at this point.  We are dealing with comfort measures.  I just want her to have respect and dignity.  She is very very nice.  She understands what is going on and what she is facing.  She does have a sense of calm about this.  Again, once we have the pathology report  back, I will call [facility] and see about getting her over there.  We had a very good prayer session today.  She does have a strong faith.  This I think is what we will get her through this difficult time.  Lattie Haw, MD  2 Timothy 4:6-8

## 2018-12-25 NOTE — Discharge Summary (Signed)
[facility] Discharge Summary  Patient name: Sarah Moody Medical record number: [id number] Date of birth: August 30, 1945 Age: 73 y.o. Gender: female Date of Admission: 12/18/2018  Date of Discharge: 078/05/2018 Admitting Physician: Sarah Moody McDiarmid, Moody  Primary Care Provider: McDiarmid, Sarah Moody: Palliative, IR  Indication for Hospitalization:  Sarah Moody is a 73 y.o. female presenting with increasing weakness and fatigue . PMH is significant for HTN, Anemia and poor differentiated carcinoma.  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 No abdominal pain, large firm masses felt on palpation MSK: Upper extremity strength 5/5 bilaterally, Lower extremity strength 4/5 bilaterally        Derm: No rashes noted Neuro: A&Ox4 Psych: Behavior and speech appropriate to situation, depressed mood and tearful  Brief Hospital Course:   Symptomatic anemia 2/2 metastatic disease Pt was admitted following fatigue, weakness, growing lower abdominal mass, 50lb weight loss over the last 8 months. Hb was 6.9, Hct 24.5 and pt was treated as symptomatic anemia. Pt was transfused 1 unit RBC. 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 Pulm, hepatic, subcutaneous, peritoneal and bladder wall lesions also present. Multiple bilateral DVTs and PEs in lower lobes. Pt was initially started on Eliquis on 7/28 5mg  BID but then stopped on 7/31 due to decision of comfort care  only. Biopsy of lesions in the CT were recommended. Biopsy of subcutaneous abdominal lesion on 7/29 showed poorly differentiated carcinoma. Sarah Moody and Sarah Moody- consulted the patient daily and were a large support system for Sarah Moody as she struggled to cope with the unfortunate news of her poor prognosis. Pt decided to go to [facility] for comfort care. Many of Sarah Moody medications have been discontinued in light goals of care being comfort.   Issues for Follow Up:  1. Monitor for any increase in pain  2. Comfort care measures    Significant Procedures:   RBC transfusion X 2units  Biopsy of subcutaneous nodule   Significant Labs and Imaging:  Recent Labs  Lab 12/25/18 0543 12/26/18 0813 12/27/18 0125  WBC 12.8* 12.7* 11.9*  HGB 7.1* 7.7* 7.5*  HCT 25.8* 26.4* 25.4*  PLT 497* 506* 526*   Recent Labs  Lab 12/23/18 0707 12/24/18 0528 12/25/18 0543 12/26/18 0813 12/27/18 0125  NA 135 135 136 135 136  K 3.4* 3.4* 4.0 2.9* 3.0*  CL 107 107 111 107 109  CO2 17* 12* 17* 21* 21*  GLUCOSE 69* 58* 93 97 100*  BUN 8 7* 8 8 10   CREATININE 0.62 0.68 0.56 0.53 0.49  CALCIUM 7.5* 7.7* 7.6* 7.6* 7.4*  MG 1.4* 1.6* 1.6* 1.6* 1.8  PHOS 2.5 2.8  --  2.2* 2.5      Results/Tests Pending at Time of Discharge: none  Discharge Medications:  Allergies as of 12/28/2018      Reactions   Prevnar [pneumococcal 13-val Conj Vacc] Itching, Swelling, Other (See Comments)   Patient had swelling, itching, redness and warm to touch at injection site.     Amoxicillin    REACTION: diarrhea   Tetanus-diphtheria  Toxoids Td    REACTION: unspecified      Medication List    STOP taking these medications   acetaminophen 500 MG tablet Commonly known as: TYLENOL   Commode 3-In-1 Misc   diclofenac 75 MG EC tablet Commonly known as: VOLTAREN   omeprazole 20 MG tablet Commonly known as: PRILOSEC OTC   Rollator Ultra-Light Misc     TAKE these medications   pantoprazole  40 MG tablet Commonly known as: PROTONIX Take 1 tablet (40 mg total) by mouth 2 (two) times daily.       Discharge Instructions: Please refer to Patient Instructions section of EMR for full details.  Patient was counseled important signs and symptoms that should prompt return to medical care, changes in medications, dietary instructions, activity restrictions, and follow up appointments.   Follow-Up Appointments: Follow-up Information    Moody, Sarah Moody Follow up in 1 week(s).   Specialty: Family Medicine Why: hospital f/u Contact information: [place] [address] [phone]           Sarah Leitz, Moody 12/28/2018, 9:32 AM PGY-1, [place]

## 2018-12-25 NOTE — Progress Notes (Signed)
Called patient to ask whether it is okay to transfuse another 1 unit of RBCs following discussion in rounds. Patient was unhappy that a DNR band was placed on her wrist and was asking why.   I went to see [name] I explained that Dr. Marin Olp had had a resuscitation discussion with her earlier this morning and she had stated that she did not want these life-prolonging prolonging measures.  I asked her if she would like to change her mind, she shook her head and quickly said "leave me on DNR".  I had a further discussion about DNR options, she reiterated that she would like to be a DNR.  I explained that she can change her status and choose to be for resuscitation if she wishes.  Patient understood.  Lattie Haw, MD PGY1

## 2018-12-26 DIAGNOSIS — E878 Other disorders of electrolyte and fluid balance, not elsewhere classified: Secondary | ICD-10-CM

## 2018-12-26 DIAGNOSIS — C799 Secondary malignant neoplasm of unspecified site: Secondary | ICD-10-CM

## 2018-12-26 LAB — CBC
HCT: 26.4 % — ABNORMAL LOW (ref 36.0–46.0)
Hemoglobin: 7.7 g/dL — ABNORMAL LOW (ref 12.0–15.0)
MCH: 23.8 pg — ABNORMAL LOW (ref 26.0–34.0)
MCHC: 29.2 g/dL — ABNORMAL LOW (ref 30.0–36.0)
MCV: 81.7 fL (ref 80.0–100.0)
Platelets: 506 10*3/uL — ABNORMAL HIGH (ref 150–400)
RBC: 3.23 MIL/uL — ABNORMAL LOW (ref 3.87–5.11)
RDW: 21.4 % — ABNORMAL HIGH (ref 11.5–15.5)
WBC: 12.7 10*3/uL — ABNORMAL HIGH (ref 4.0–10.5)
nRBC: 0 % (ref 0.0–0.2)

## 2018-12-26 LAB — PHOSPHORUS: Phosphorus: 2.2 mg/dL — ABNORMAL LOW (ref 2.5–4.6)

## 2018-12-26 LAB — BASIC METABOLIC PANEL
Anion gap: 7 (ref 5–15)
BUN: 8 mg/dL (ref 8–23)
CO2: 21 mmol/L — ABNORMAL LOW (ref 22–32)
Calcium: 7.6 mg/dL — ABNORMAL LOW (ref 8.9–10.3)
Chloride: 107 mmol/L (ref 98–111)
Creatinine, Ser: 0.53 mg/dL (ref 0.44–1.00)
GFR calc Af Amer: >60 mL/min (ref >60)
GFR calc non Af Amer: >60 mL/min (ref >60)
Glucose, Bld: 97 mg/dL (ref 70–99)
Potassium: 2.9 mmol/L — ABNORMAL LOW (ref 3.5–5.1)
Sodium: 135 mmol/L (ref 135–145)

## 2018-12-26 LAB — MAGNESIUM: Magnesium: 1.6 mg/dL — ABNORMAL LOW (ref 1.7–2.4)

## 2018-12-26 MED ORDER — MAGNESIUM SULFATE 2 GM/50ML IV SOLN
2.0000 g | Freq: Once | INTRAVENOUS | Status: AC
  Administered 2018-12-26: 2 g via INTRAVENOUS
  Filled 2018-12-26: qty 50

## 2018-12-26 MED ORDER — ONDANSETRON HCL 4 MG/2ML IJ SOLN
4.0000 mg | Freq: Four times a day (QID) | INTRAMUSCULAR | Status: AC | PRN
  Administered 2018-12-26: 4 mg via INTRAVENOUS
  Filled 2018-12-26: qty 2

## 2018-12-26 MED ORDER — K PHOS MONO-SOD PHOS DI & MONO 155-852-130 MG PO TABS
250.0000 mg | ORAL_TABLET | Freq: Three times a day (TID) | ORAL | Status: AC
  Administered 2018-12-26 – 2018-12-27 (×3): 250 mg via ORAL
  Filled 2018-12-26 (×3): qty 1

## 2018-12-26 MED ORDER — POTASSIUM CHLORIDE CRYS ER 10 MEQ PO TBCR: 40.0000 meq | EXTENDED_RELEASE_TABLET | ORAL | Status: DC

## 2018-12-26 MED ORDER — POTASSIUM CHLORIDE CRYS ER 10 MEQ PO TBCR
40.0000 meq | EXTENDED_RELEASE_TABLET | Freq: Once | ORAL | Status: AC
  Administered 2018-12-26: 40 meq via ORAL
  Filled 2018-12-26: qty 4

## 2018-12-26 NOTE — Plan of Care (Signed)
  Problem: Education: Goal: Knowledge of General Education information will improve Description Including pain rating scale, medication(s)/side effects and non-pharmacologic comfort measures Outcome: Progressing   

## 2018-12-26 NOTE — Care Management Important Message (Signed)
Important Message  Patient Details  Name: Sarah Moody MRN: [id number] Date of Birth: 04-18-1946   Medicare Important Message Given:  Yes     [name] [address] 12/26/2018, 12:51 PM

## 2018-12-26 NOTE — Progress Notes (Addendum)
Family Medicine Teaching Service Daily Progress Note Intern Pager: [phone]  Patient name: Sarah Moody Medical record number: [id number] Date of birth: [birth year] Age: 73 y.o. Gender: female  Primary Care Provider: McDiarmid, Blane Ohara, MD Consultants: Heme/Onc Code Status: DNR  Pt Overview and Major Events to Date:  Sarah Moody is a 73 y.o. female presenting with increasing weakness and fatigue . PMH is significant for HTN, Anemia and likely metastatic cancer.  Assessment and Plan:  Symptomatic Anemia-stable Denies feeling dizzy, only weakness and fatigue  -Hb post 1 RBC 7.7 today,  Hb 7.1 7/29. 6.7 on admission req 1 unit RBC  -Had IV fereheme 510mg  on 7/26 -Continue N/S at 474QV/ZD -Folic Acid 1mg  daily and multivitamins 1 tab daily  Likely Metastatic Ca  -Awaiting biopsy results of subcutaneous nodule biopsy on 7/28, either carcinoma or lymphoma, awaiting stain results -Oncology consulted, appreciate recs      -Dr. Marin Olp feels that best option for patient is going to hospice-[facility]. Unable to make referral until biopsy results are back.  He will kindly call       [facility] after results.      -Following a discussion with her she is now DNR      -Approach should be comfort measures -Started on Eliquis 5 mg twice daily on 7/27, discuss with Dr Owens Shark regarding stopping/continuing anticoagulation         Weight Loss-chronic -Nutritional appreciate recs- Boost Plus chocolate BID- Each supplement provides 360kcal and 14g protein.   Magic cup TID with meals, each supplement provides 290 kcal and 9 grams of protein -BMP each morning -Magnesium and phosphorus due today  Abdominal Pain-stable, only positional Denies worsening of pain, pain only on movement or palpation of abdomen -Acetaminophen prn, continue to assess pain control -Protonix 40mg  BID -Tums daily  Leukocytosis-stable WCB 12.7 today,12.8 on 7/29,15 on admission -CBC in am  Hypomagnesemia Mg 1.6  7/29 -Continue to monitor replete as necessary  Hypokalemia K 4 7/29, 3.4 on 7/28 -Continue to replete with oral potassium tablets -Continue to monitor  FEN/GI: Regular diet, Protonix PPx: SCD's  Disposition: Possibly hospice after pathology results are back  Subjective:   Pt has feel similar to yesterday. Reports coughing/vomiting over night producing clear sputum and her bringing up boost drink. No hematemesis. No dyspnea, fevers, further coughing episodes, dizziness. Legs feel very weak and was only able to take a few steps with the physio yesterday. I explained this is understandable given her condition and because of deconditioning in hospital over the last 1 week.   Objective: Temp:  [97.7 F (36.5 C)-98.1 F (36.7 C)] 97.9 F (36.6 C) (07/30 0533) Pulse Rate:  [98-107] 101 (07/30 0533) Resp:  [16-18] 18 (07/30 0533) BP: (112-127)/(66-72) 116/70 (07/30 0533) SpO2:  [98 %-100 %] 100 % (07/30 0533)   General: Alert and cooperative and appears to be in no acute distress HEENT: Neck non-tender without lymphadenopathy, masses or thyromegaly Cardio: Normal S1 and S2, no S3 or S4. Rhythm is regular. No murmurs or rubs.   Pulm: Clear to auscultation bilaterally, no crackles, wheezing, or diminished breath sounds. Normal respiratory effort Abdomen: Bowel sounds normal. Obvious lower abdominal mass on palpation, increased generalized tenderness compared to yesterday of abdomen, no guarding.  Extremities: No peripheral edema. Warm/ well perfused.  Weak radial pulse. Neuro: Cranial nerves grossly intact Psych: low mood, low affect  Laboratory: Recent Labs  Lab 12/23/18 0707 12/24/18 0528 12/25/18 0543  WBC 11.8* 12.9* 12.8*  HGB  7.6* 7.8* 7.1*  HCT 27.3* 29.3* 25.8*  PLT 506* 425* 497*   Recent Labs  Lab 12/19/18 0748  12/23/18 0707 12/24/18 0528 12/25/18 0543  NA 137   < > 135 135 136  K 3.1*   < > 3.4* 3.4* 4.0  CL 106   < > 107 107 111  CO2 21*   < > 17* 12* 17*   BUN 11   < > 8 7* 8  CREATININE 0.63   < > 0.62 0.68 0.56  CALCIUM 7.7*   < > 7.5* 7.7* 7.6*  PROT 5.4*  --   --   --   --   BILITOT 0.9  --   --   --   --   ALKPHOS 89  --   --   --   --   ALT 8  --   --   --   --   AST 15  --   --   --   --   GLUCOSE 84   < > 69* 58* 93   < > = values in this interval not displayed.      Imaging/Diagnostic Tests: No results found.  Lattie Haw, MD 12/26/2018, 6:17 AM PGY-1, [place] Intern pager: [phone], text pages welcome

## 2018-12-26 NOTE — Progress Notes (Addendum)
FAMILY MEDICINE TEACHING SERVICE Patient - Please contact intern pager [phone] or check [facility] residency on via website [email or web address] ([email or web address]: [email or web address]) for questions regarding care. Text pages welcome. DO NOT page listed attending provider unless there is no answer from the number above.  

## 2018-12-26 NOTE — Progress Notes (Signed)
Nutrition Brief Note  Consult received due to refeeding risk. See initial assessment from 7/23. Oncology saw patient yesterday, she was made DNR focusing on comfort measures. Plan for d/c to [facility] place once pathology results are in. Continue diet as tolerated.   No nutrition interventions warranted at this time. If nutrition issues arise, please consult RD.   Mariana Single RD, LDN Clinical Nutrition Pager # [phone]

## 2018-12-26 NOTE — Progress Notes (Signed)
Visited with Patient per her request. Provided guidance as she requested.  Patient said that the doctor told her she only had to live until about Sept. Due to her cancer status.  Patient is in good spirit and just wanted to shared. Patient had questions about what palliative care was I explained to her briefly she was okay with my  explanatoin.  Patient said she was going to Hospice but would like to go home for a few days.  Patient is okay with self and speaks highly of her doctors with much respect.  Provided ministry of presence, emotional and spiritual support to patient.  Chaplain available as needed.  Jaclynn Major, [place], [facility], Pager [phone]

## 2018-12-26 NOTE — Plan of Care (Signed)
  Problem: Pain Managment: Goal: General experience of comfort will improve Outcome: Progressing   

## 2018-12-27 DIAGNOSIS — R1906 Epigastric swelling, mass or lump: Secondary | ICD-10-CM

## 2018-12-27 LAB — CBC
HCT: 25.4 % — ABNORMAL LOW (ref 36.0–46.0)
Hemoglobin: 7.5 g/dL — ABNORMAL LOW (ref 12.0–15.0)
MCH: 24.3 pg — ABNORMAL LOW (ref 26.0–34.0)
MCHC: 29.5 g/dL — ABNORMAL LOW (ref 30.0–36.0)
MCV: 82.2 fL (ref 80.0–100.0)
Platelets: 526 10*3/uL — ABNORMAL HIGH (ref 150–400)
RBC: 3.09 MIL/uL — ABNORMAL LOW (ref 3.87–5.11)
RDW: 22.4 % — ABNORMAL HIGH (ref 11.5–15.5)
WBC: 11.9 10*3/uL — ABNORMAL HIGH (ref 4.0–10.5)
nRBC: 0 % (ref 0.0–0.2)

## 2018-12-27 LAB — MAGNESIUM: Magnesium: 1.8 mg/dL (ref 1.7–2.4)

## 2018-12-27 LAB — BASIC METABOLIC PANEL
Anion gap: 6 (ref 5–15)
BUN: 10 mg/dL (ref 8–23)
CO2: 21 mmol/L — ABNORMAL LOW (ref 22–32)
Calcium: 7.4 mg/dL — ABNORMAL LOW (ref 8.9–10.3)
Chloride: 109 mmol/L (ref 98–111)
Creatinine, Ser: 0.49 mg/dL (ref 0.44–1.00)
GFR calc Af Amer: >60 mL/min (ref >60)
GFR calc non Af Amer: >60 mL/min (ref >60)
Glucose, Bld: 100 mg/dL — ABNORMAL HIGH (ref 70–99)
Potassium: 3 mmol/L — ABNORMAL LOW (ref 3.5–5.1)
Sodium: 136 mmol/L (ref 135–145)

## 2018-12-27 LAB — PHOSPHORUS: Phosphorus: 2.5 mg/dL (ref 2.5–4.6)

## 2018-12-27 NOTE — Progress Notes (Signed)
I got the pathology report back yesterday afternoon.  She has a poorly differentiated carcinoma.  This is no surprise.  This is incredibly aggressive.  I talked to her about this this morning.  She really was not that surprised.  I spoke to the head of [facility].  They will have somebody come over to see Ms. Paar today.  Hopefully, they will have a bed for her.  Given the diagnosis, and given the condition of Ms. Guarino, I would be surprised if she made it more than 2 or 3 weeks at this point.  I definitely would not give her any more transfusions.  This would only prolong her situation and cause more suffering for her.  She is not eating much.  This is no surprise.  I do not think that she probably will eat all that much.  She is not really hurting.  As always, we had a very good prayer session.  She still has a very strong faith.  Hopefully, she will be able to go to [facility] today.   I really would not put her on any anticoagulation.  I think this might cause issues with bleeding.  Again, Ms. Hamlett just has a very aggressive malignancy.  Her body is very weak.  I am glad that [facility] will be able to take her.  Please make sure that she has ambulance transport over to [facility].  I appreciate all the great care that she got from everybody up on 40M.  Lattie Haw, MD  2 Timothy 4:16-18

## 2018-12-27 NOTE — Clinical Social Work Note (Signed)
CSW reviewed notes and read note by Dr. Marin Olp that he contacted [facility] regarding a bed for patient. CSW talked with Bevely Palmer, RN with [facility] regarding patient. CSW then visited with Sarah Moody and confirmed that she is in agreement with going to [facility]. She did verbalize that at one time she wanted to go home for a few days, but has decided to go directly to [facility]. Bevely Palmer called and talked with patient then CSW advised by Bevely Palmer that someone will meet with patient tomorrow morning to do paperwork. Patient will probably discharge to [facility] on Saturday, 8/1.  Xia Stohr Givens, MSW, LCSW Licensed Clinical Social Worker [place] [phone]

## 2018-12-27 NOTE — Progress Notes (Signed)
Family Medicine Teaching Service Daily Progress Note Intern Pager: [phone]  Patient name: Sarah Moody Medical record number: [id number] Date of birth: 1945/12/11 Age: 73 y.o. Gender: female  Primary Care Provider: McDiarmid, Blane Ohara, MD Consultants: Heme/Onc Code Status: DNR  Pt Overview and Major Events to Date:  Sarah Moody is a 73 y.o. female presenting with increasing weakness and fatigue . PMH is significant for HTN, Anemia and likely metastatic cancer.  Assessment and Plan:  Symptomatic Anemia-stable Denies feeling dizzy, only weakness and fatigue  -Hb 7.5, Hb 7.7 7/30. 6.7 on admission req 1 unit RBC  -Had IV fereheme 510mg  on 7/26 -Continue N/S at 932TF/TD -Folic Acid 1mg  daily and multivitamins 1 tab daily  Likely Metastatic Ca  Patient would like to go to hospice as this is the best option for her after discharging from hospital, as per conversation with Dr. Marin Olp this morning. -Awaiting biopsy results of subcutaneous nodule biopsy on 7/28, either carcinoma or lymphoma, awaiting stain results -Oncology consulted, appreciate recs      -Dr. Marin Olp feels that best option for patient is going to hospice-[facility].       -Unable to make referral until biopsy results are back.  He will kindly [facility] after results.      -Patient is DNR      -Approach should be comfort measures -Eliquis 5 mg twice daily on 7/2-discussion with pt with her preference         Weight Loss-chronic Still reports decreased appetite decreased appetite enjoys drinking nutritional drinks -Nutritional appreciate recs- Boost Plus chocolate BID- Each supplement provides 360kcal and 14g protein.   -Magic cup TID with meals, each supplement provides 290 kcal and 9 grams of protein -BMP each morning -Continue to monitor Mg, K and phosp   Abdominal Pain-stable, only positional Denies worsening of pain, pain only on movement or palpation of abdomen, refused abdominal exam this  morning -Acetaminophen prn, continue to assess pain control -Protonix 40mg  BID -Tums daily  Leukocytosis-stable WCB 11.9 today,15 on admission -CBC in am  Hypomagnesemia likely 2/2 refeeding syndrome Mg 1.8, 1.6 on 7/30 -Continue to monitor replete as necessary  Hypokalemia likely 2/2 refeeding syndrome K 3 today, 2.9 7/30  -Continue to replete with oral potassium tablets -Continue to monitor  Hypophosphatemia likely 2/2 refeeding syndrome Phosp 2.5, 2.2 7/30 -continue to replace with KPhosp -continue to monitor and replete if low  FEN/GI: Regular diet, Protonix PPx: SCD's  Disposition: Possibly hospice after pathology results are back  Subjective:  Patient feels similar to yesterday. Reports increased swelling in her legs bilaterally (nontender) and a reduced appetite but enjoys drinking nutritional drinks.  No chest pain, shortness of breath, vomiting, or fevers.  No new concerns   Objective: Temp:  [98 F (36.7 C)-98.4 F (36.9 C)] 98.3 F (36.8 C) (07/31 0630) Pulse Rate:  [86-103] 96 (07/31 0630) Resp:  [16-20] 16 (07/31 0630) BP: (106-123)/(68-74) 119/72 (07/31 0630) SpO2:  [99 %-100 %] 100 % (07/31 0630)   General: Alert and cooperative and appears to be in no acute distress HEENT: Neck non-tender without lymphadenopathy, masses or thyromegaly Cardio: Normal S1 and S2, no S3 or S4. Rhythm is regular. No murmurs or rubs.   Pulm: Clear to auscultation bilaterally, no crackles, wheezing, or diminished breath sounds. Normal respiratory effort Abdomen: refused abdominal exam  Extremities: Trace bilateral nonpitting edema of lower extremities compared to yesterday's exam. warm/ well perfused.  Weak radial pulse neuro: Cranial nerves grossly intact  Laboratory: Recent Labs  Lab 12/25/18 0543 12/26/18 0813 12/27/18 0125  WBC 12.8* 12.7* 11.9*  HGB 7.1* 7.7* 7.5*  HCT 25.8* 26.4* 25.4*  PLT 497* 506* 526*   Recent Labs  Lab 12/25/18 0543 12/26/18 0813  12/27/18 0125  NA 136 135 136  K 4.0 2.9* 3.0*  CL 111 107 109  CO2 17* 21* 21*  BUN 8 8 10   CREATININE 0.56 0.53 0.49  CALCIUM 7.6* 7.6* 7.4*  GLUCOSE 93 97 100*      Imaging/Diagnostic Tests: No results found.  Lattie Haw, MD 12/27/2018, 6:41 AM PGY-1, [place] Intern pager: [phone], text pages welcome

## 2018-12-27 NOTE — Progress Notes (Signed)
PT Cancellation Note  Patient Details Name: Sarah Moody MRN: [id number] DOB: Feb 13, 1946   Cancelled Treatment:    Reason Eval/Treat Not Completed: Other (comment).  Pt is asking to be discharged from PT now so will comply.  Asked her to let nursing know if she changes her mind.   Ramond Dial 12/27/2018, 2:09 PM   Mee Hives, PT MS Acute Rehab Dept. Number: [place] [facility] [place]

## 2018-12-28 MED ORDER — PANTOPRAZOLE SODIUM 40 MG PO TBEC: 40.0000 mg | DELAYED_RELEASE_TABLET | Freq: Two times a day (BID) | ORAL | 0 refills | Status: AC

## 2018-12-28 NOTE — Plan of Care (Signed)
  Problem: Education: Goal: Knowledge of General Education information will improve Description: Including pain rating scale, medication(s)/side effects and non-pharmacologic comfort measures Outcome: Adequate for Discharge   

## 2018-12-28 NOTE — Progress Notes (Signed)
Family Medicine Teaching Service Daily Progress Note Intern Pager: [phone]  Patient name: Sarah Moody Medical record number: [id number] Date of birth: [birth year] Age: 73 y.o. Gender: female  Primary Care Provider: McDiarmid, Blane Ohara, MD Consultants: Heme/Onc Code Status: DNR  Pt Overview and Major Events to Date:  Sarah Moody is a 73 y.o. female presenting with increasing weakness and fatigue . PMH is significant for HTN, Anemia and likely metastatic cancer.  Assessment and Plan:  Symptomatic Anemia-stable -Folic Acid 1mg  daily and multivitamins 1 tab daily  Metastatic Ca  Patient would like to go to hospice as this is the best option for her after discharging from hospital, as per conversation with Dr. Marin Olp this morning. -biopsy results of subcutaneous nodule biopsy on 7/28, poorly differentiated carcinoma -Oncology consulted, appreciate recs      -Dr. Marin Olp feels that best option for patient is going to hospice-[facility].       -Unable to make referral until biopsy results are back.  He will kindly call [facility] after results.      -Patient is DNR      -Approach should be comfort measures -Eliquis 5 mg twice daily d/c'd 07/30        Weight Loss-chronic Still reports decreased appetite decreased appetite enjoys drinking nutritional drinks -Nutritional appreciate recs- Boost Plus chocolate BID- Each supplement provides 360kcal and 14g protein.   -Magic cup TID with meals, each supplement provides 290 kcal and 9 grams of protein    Abdominal Pain-stable, only positional Denies worsening of pain, pain only on movement, no pain palpation of abdomen,  -Acetaminophen prn, continue to assess pain control -Protonix 40mg  BID -Tums daily  Leukocytosis-stable WBC 11.9 07/31  Hypomagnesemia likely 2/2 refeeding syndrome Mg 1.8 07/31  Hypokalemia likely 2/2 refeeding syndrome K 3 07/31  Hypophosphatemia likely 2/2 refeeding syndrome Phosp 2.5  07/31   FEN/GI: Regular diet, Protonix PPx: SCD's  Disposition: Discharge today  Subjective:   Pt feeling a little better today.  She reports no increase in leg swelling or pain but does say they feel tight.  She has little appetite but enjoys the bacon so she will attempt to have some. Denies any SOB, chest pain, n/v, abdo pain.  She is concerned about end of life care process.  She states she doesn't want to suffer.  Dr Owens Shark came by and discussed with pt her concerns.  Pt feels better now, no further concerns voiced.    Objective: Temp:  [97.9 F (36.6 C)-98.6 F (37 C)] 98 F (36.7 C) (08/01 0407) Pulse Rate:  [101-108] 101 (08/01 0407) Resp:  [18-20] 20 (08/01 0407) BP: (109-128)/(62-71) 109/62 (08/01 0407) SpO2:  [99 %-100 %] 100 % (08/01 0407) Weight:  [79.2 kg] 79.2 kg (08/01 0407)   General: Alert and oriented, no apparent distress  Eyes: PEERLA ENTM: No pharyengeal erythema Neck: nontender Cardiovascular: RRR with no murmurs noted Respiratory: CTA bilaterally  Gastrointestinal: Bowel sounds present. No abdominal pain, large firm masses felt on palpation MSK: Upper extremity strength 5/5 bilaterally, Lower extremity strength 4/5 bilaterally  Derm: No rashes noted Neuro: A&Ox4 Psych: Behavior and speech appropriate to situation, depressed mood and tearful  Laboratory: Recent Labs  Lab 12/25/18 0543 12/26/18 0813 12/27/18 0125  WBC 12.8* 12.7* 11.9*  HGB 7.1* 7.7* 7.5*  HCT 25.8* 26.4* 25.4*  PLT 497* 506* 526*   Recent Labs  Lab 12/25/18 0543 12/26/18 0813 12/27/18 0125  NA 136 135 136  K 4.0 2.9* 3.0*  CL 111 107 109  CO2 17* 21* 21*  BUN 8 8 10   CREATININE 0.56 0.53 0.49  CALCIUM 7.6* 7.6* 7.4*  GLUCOSE 93 97 100*      Imaging/Diagnostic Tests: No results found.  Carollee Leitz, MD 12/28/2018, 9:14 AM PGY-1, [place] Intern pager: [phone], text pages welcome

## 2018-12-28 NOTE — Progress Notes (Signed)
Called report to [facility]. Thank you.   Farley Ly RN

## 2018-12-28 NOTE — TOC Transition Note (Signed)
Transition of Care [facility]) - CM/SW Discharge Note   Patient Details  Name: Sarah Moody MRN: [id number] Date of Birth: [birth year]  [facility]) CM/SW Contact:  Gelene Mink, [place] Phone Number: 12/28/2018, 1:59 PM   Clinical Narrative:     Patient will DC to: [facility] Anticipated DC date: 12/28/2018 Family notified: Yes Transport by: Corey Harold   Per MD patient ready for DC to . RN, patient, patient's family, and facility notified of DC. Discharge Summary sent to facility. RN to call report prior to discharge [phone]. DC packet on chart. Ambulance transport requested for patient.   CSW will sign off for now as social work intervention is no longer needed. Please consult [place] again if new needs arise.  Samual Beals, LCSW-A [place]/Clinical Social Work Department Cell: [phone]    Final next level of care: [place] Barriers to Discharge: No Barriers Identified   Patient Goals and CMS Choice Patient states their goals for this hospitalization and ongoing recovery are:: Pt will resume comfort care at [facility] Medicare.gov Compare Post Acute Care list provided to:: Patient Choice offered to / list presented to : Patient  Discharge Placement              Patient chooses bed at: Other - please specify in the comment section below:([facility]) Patient to be transferred to facility by: [facility] [place] Name of family member notified: Linton Rump Patient and family notified of of transfer: 12/28/18  Discharge Plan and Services                DME Arranged: N/A DME Agency: NA       HH Arranged: NA HH Agency: NA        Social Determinants of Health (SDOH) Interventions     Readmission Risk Interventions No flowsheet data found.

## 2018-12-28 NOTE — Progress Notes (Signed)
[employer]ufacturing [employer] [facility]) RN Registration Visit 7639  Chart reviewed and received report from bedside RN (Lori)the patient is agreeable to transfer today. CSW Fara Chute) aware. Registration paperwork work completed at 1052 this morning. Explained services, answered questions and offered support. Pt signed forms and provided financial information.  Pt aware admission to [facility] services will not occur until patient arrives at [facility]. Pt is also aware [facility] is responsible for arranging transportation and pt/family or current insurance is responsible for payment of transportation. Room and board charges assessed at $37.50 based on financial assessment. Dr. Orpah Melter to assume care per pt's request for hospice facility physician.  Forms delivered to [facility] including contact information.  Please fax discharge summary to [phone]. RN please call report to [phone]. Please arrange transport for pt to arrive today. Thank You.  Raina Mina, RN , BSN [facility] Liaison [phone]  [facility]) Liaison are under Hospice and Palliative Care on AMION

## 2018-12-28 NOTE — Plan of Care (Signed)
  Problem: Education: Goal: Knowledge of General Education information will improve Description Including pain rating scale, medication(s)/side effects and non-pharmacologic comfort measures Outcome: Progressing   

## 2018-12-28 NOTE — Plan of Care (Signed)
  Problem: Clinical Measurements: Goal: Ability to maintain clinical measurements within normal limits will improve Outcome: Progressing   

## 2018-12-28 NOTE — Progress Notes (Signed)
Sarah Moody to be discharged [facility] per MD order. Patient verbalized understanding.  Skin clean, dry and intact without evidence of skin break down, no evidence of skin tears noted. IV catheter discontinued intact. Site without signs and symptoms of complications. Dressing and pressure applied. Pt denies pain at the site currently. No complaints noted.  Patient free of lines, drains, and wounds.   Discharge packet assembled. An After Visit Summary (AVS) was printed and given to the EMS personnel. Patient escorted via stretcher and discharged to [facility] via [place]. Report called to accepting facility; all questions and concerns addressed.   Baldo Ash, RN

## 2018-12-29 LAB — TYPE AND SCREEN
ABO/RH(D): B POS
Antibody Screen: POSITIVE
Unit division: 0
Unit division: 0

## 2018-12-29 LAB — BPAM RBC
Blood Product Expiration Date: 202008212359
Blood Product Expiration Date: 202008212359
ISSUE DATE / TIME: 202007300042
Unit Type and Rh: 7300
Unit Type and Rh: 7300

## 2019-01-07 ENCOUNTER — Telehealth: Payer: Self-pay | Admitting: Family Medicine

## 2019-01-07 ENCOUNTER — Telehealth: Payer: Self-pay | Admitting: *Deleted

## 2019-01-07 NOTE — Telephone Encounter (Signed)
Received a call from [place] in [facility] stating that patient passed away this morning 01/16/19 at 5 am.  Dr Marin Olp notified.

## 2019-01-07 NOTE — Telephone Encounter (Signed)
FMLA WH-380-F form completed by me and given to nursing staff to contact patient's son to pick form up.

## 2019-01-07 NOTE — Telephone Encounter (Signed)
LMOVM of son informing him that form was ready for pick up at the front desk.  Copy made for batch scanning. Christen Bame, CMA

## 2019-01-07 NOTE — Telephone Encounter (Signed)
Clinical info completed on FMLA form.  Place form in Dr.McDiarmid's box for completion.  Jazmin Hartsell, CMA

## 2019-01-07 NOTE — Telephone Encounter (Signed)
Pt's son came into office to drop off FMLA paperwork for himself to take care of pt. Pt's last appointment in office was 07/21. Please give pt's son a call.

## 2019-01-28 DEATH — deceased

## 2021-04-27 IMAGING — CT CT CHEST WITHOUT CONTRAST
2 of 3 series · 15 of 36 positions shown, 18 images · non-contrast
Comparison: 12/18/2018 abdominal CT

CLINICAL DATA: 72-year-old female with pulmonary nodules identified
on recent abdominal CT. Patient with pulmonary emboli.

EXAM:
CT CHEST WITHOUT CONTRAST
TECHNIQUE: Multidetector CT imaging of the chest was performed following the
standard protocol without IV contrast.

[Series 3: chest w/o 2mm st · axial · non-contrast · 0.71mm/px · z∈[+1072,+1302]mm · 12 of 136 slices shown, 15 images]
[im 11/136  mediastinal]
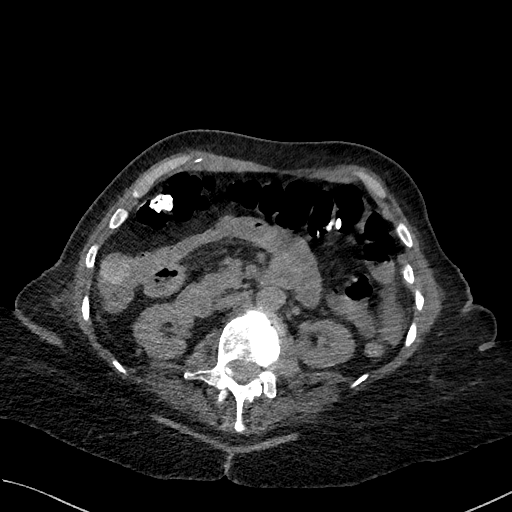
[im 11/136  lung]
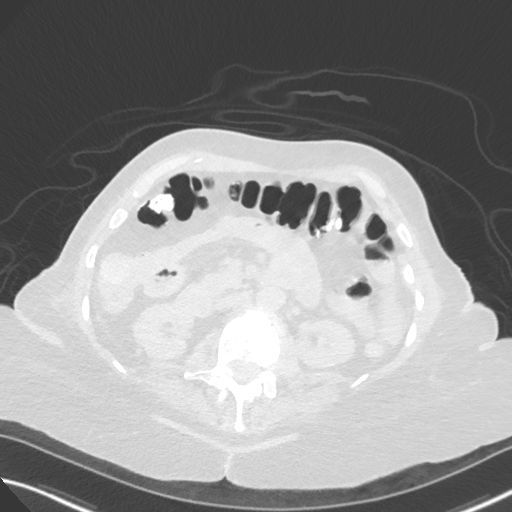
[im 21/136  lung]
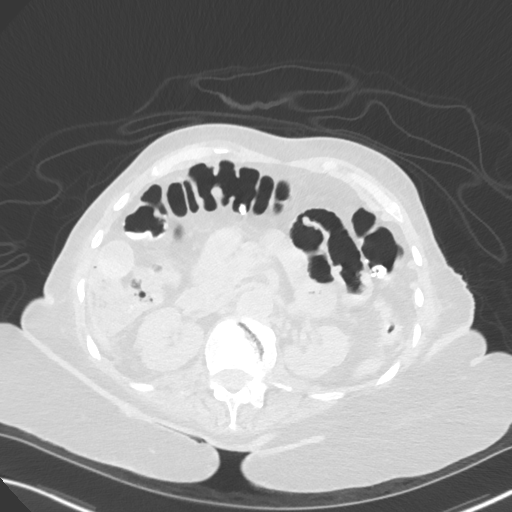
[im 31/136  lung]
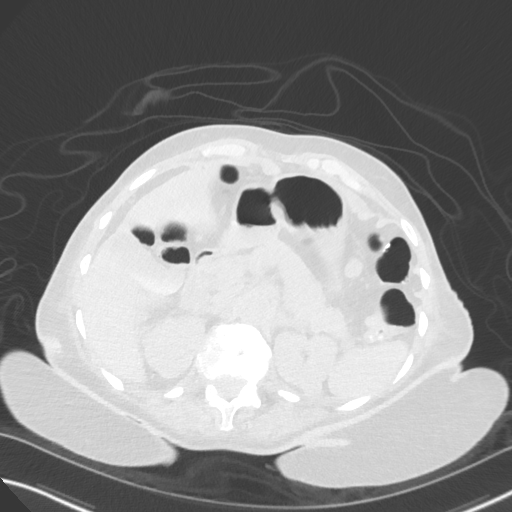
[im 41/136  lung]
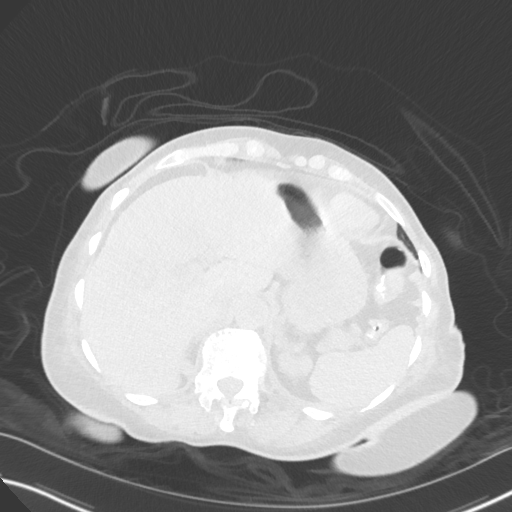
[im 51/136  mediastinal]
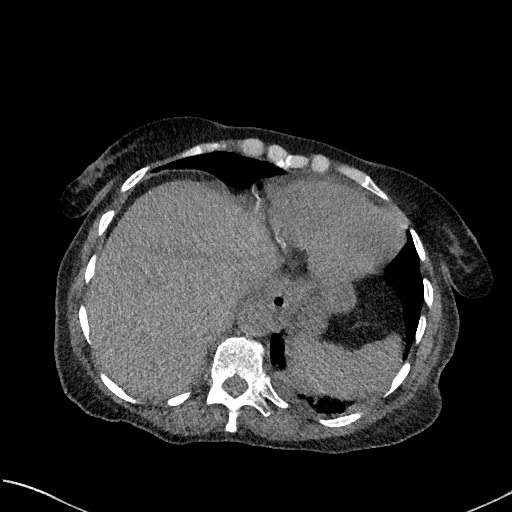
[im 51/136  lung]
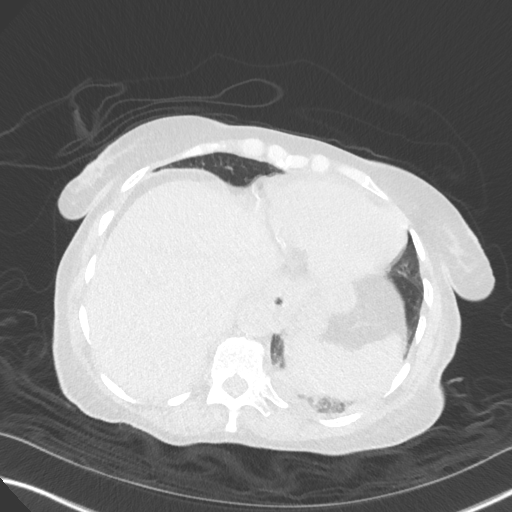
[im 61/136  lung]
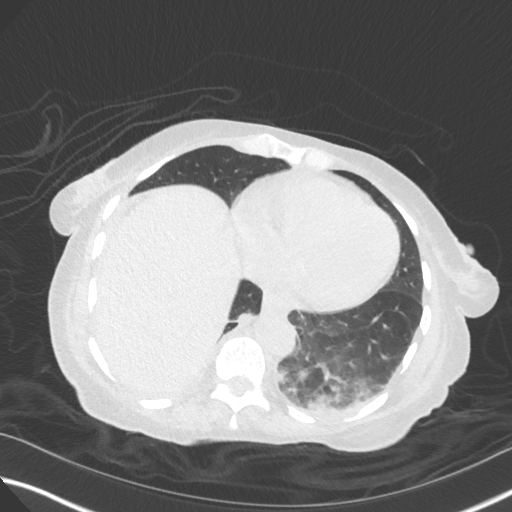
[im 76/136  lung]
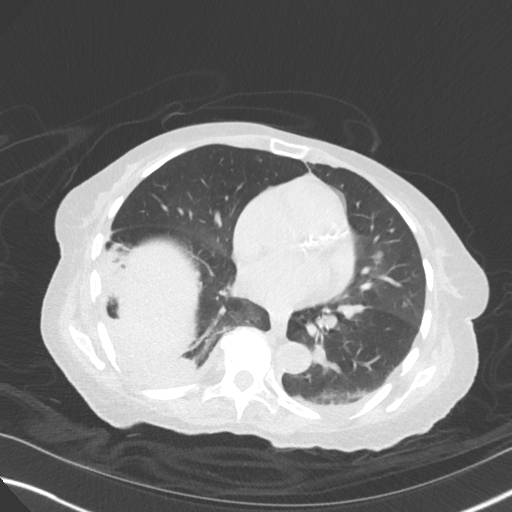
[im 86/136  lung]
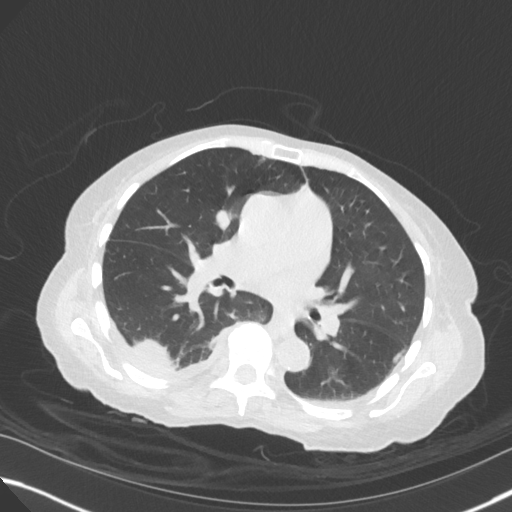
[im 96/136  mediastinal]
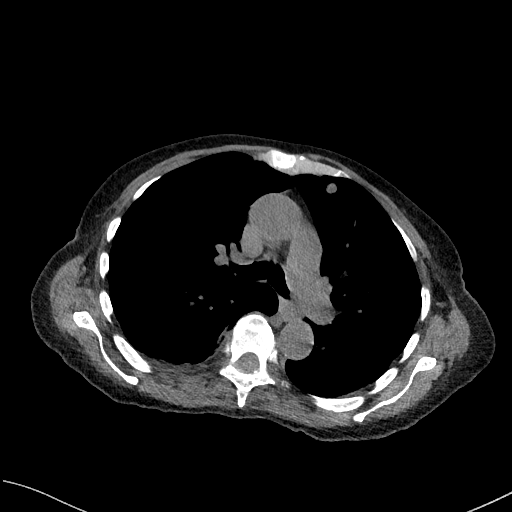
[im 96/136  lung]
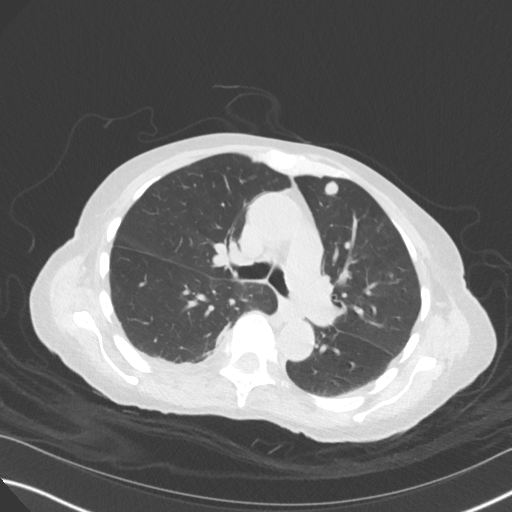
[im 106/136  lung]
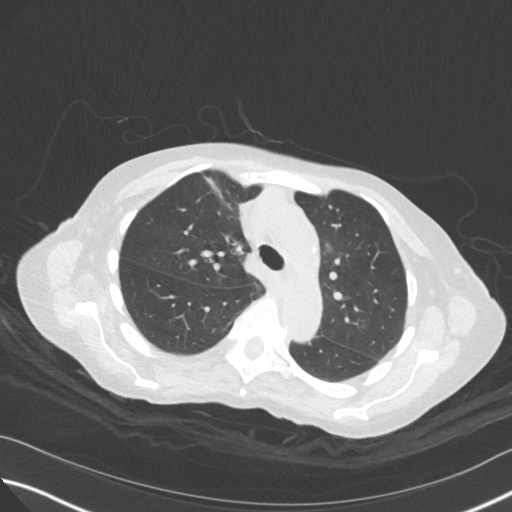
[im 116/136  lung]
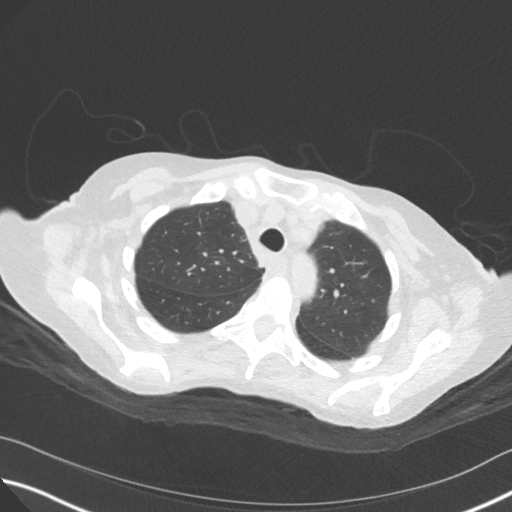
[im 126/136  lung]
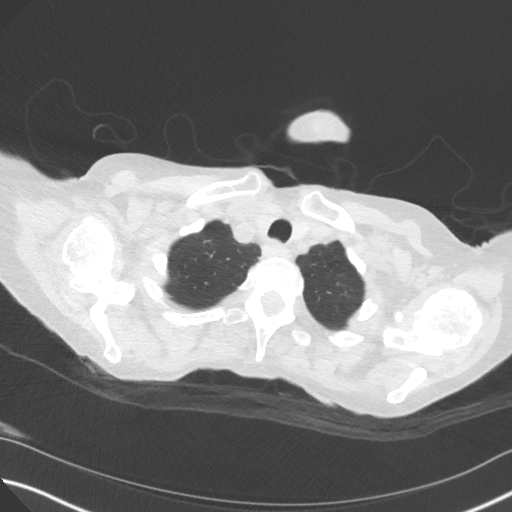

[Series 5: chest w/o 3mm st cor · coronal · non-contrast · 0.65mm/px · 3 of 98 slices shown]
[im 20/98  lung]
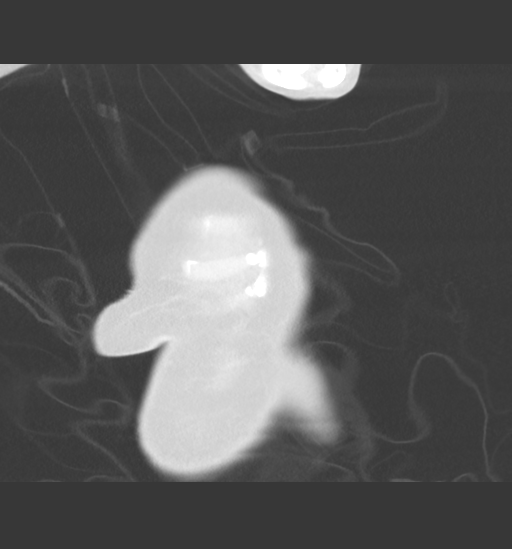
[im 39/98  lung]
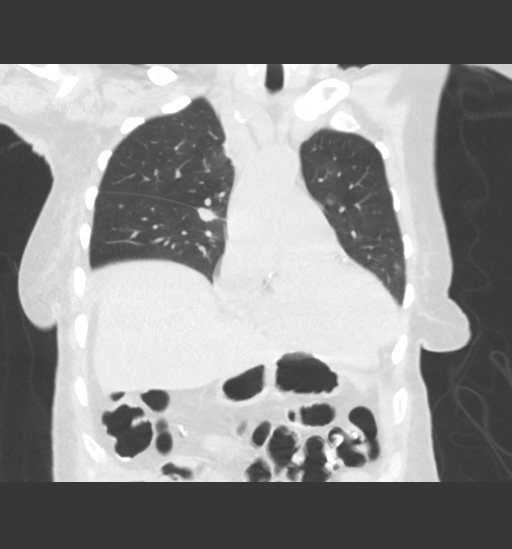
[im 59/98  lung]
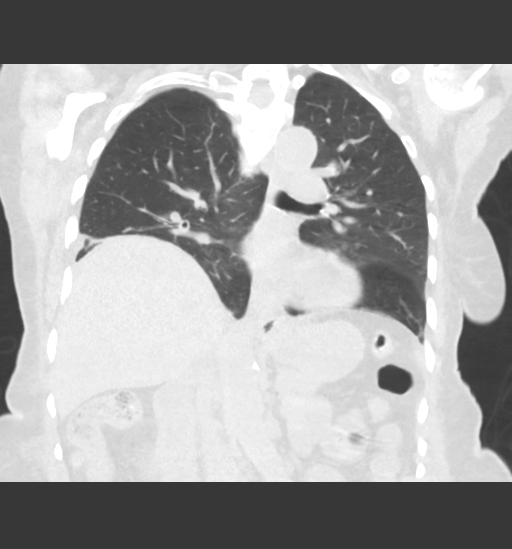

[15 of 36 positions shown; findings below may reference images not displayed]

FINDINGS: Cardiovascular: UPPER limits normal heart size noted. Coronary
artery and aortic atherosclerotic calcifications noted without
evidence of thoracic aortic aneurysm. No pericardial effusion. The
known pulmonary emboli are not visualized on this noncontrast study.

Mediastinum/Nodes: A 1 cm RIGHT retropectoral node is identified
(series 3: Image 18). No other abnormal appearing lymph nodes are
identified. No discrete mediastinal mass is noted. The visualized
thyroid gland and esophagus are unremarkable. No gross breast
abnormalities are present.

Lungs/Pleura: Multiple bilateral pulmonary nodules are identified,
compatible with metastatic disease. Index nodules are as follows:

A 1.5 cm nodule along the RIGHT minor fissure ([DATE])

A 0.8 cm RIGHT LOWER lobe nodule ([DATE])

A 1.2 cm LEFT UPPER lobe nodule ([DATE])

A 0.9 cm LEFT UPPER lobe nodule ([DATE])

A 1 cm lingular nodule ([DATE]).

Posterior RIGHT LOWER lobe focal opacity/consolidation with possible
central cavitation is again identified. Small RIGHT pleural effusion
versus pleural thickening is noted along the posterior RIGHT LOWER
lobe.

LEFT LOWER lobe patchy opacity is identified.

Upper Abdomen: No significant change from recent abdominal CT with
small scattered peritoneal/abdominal nodules

Musculoskeletal: No acute or suspicious bony abnormalities.
IMPRESSION: 1. Bilateral pulmonary nodules compatible with metastatic disease.
2. RIGHT LOWER lobe consolidation which may represent changes from
known pulmonary emboli. Very small RIGHT pleural effusion versus
pleural thickening.
3. Mild LEFT LOWER lobe atelectasis/airspace disease.
4. Unchanged abdominal findings from recent abdominal CT.
5. Coronary artery and Aortic Atherosclerosis ([id number]).

## 2021-05-01 IMAGING — US MUSCLE/SOFT TISSUE CORE BIOPSY
1 series · 9 of 9 positions shown · non-contrast
Comparison: none

INDICATION: 72-year-old with presumed metastatic disease. Patient has disease in
the abdomen and pelvis along with suspicious subcutaneous nodules.
Tissue diagnosis is needed.

[Series 1: muscle/soft tissue core biopsy · 9 of 9 slices shown]
[im 1/9]
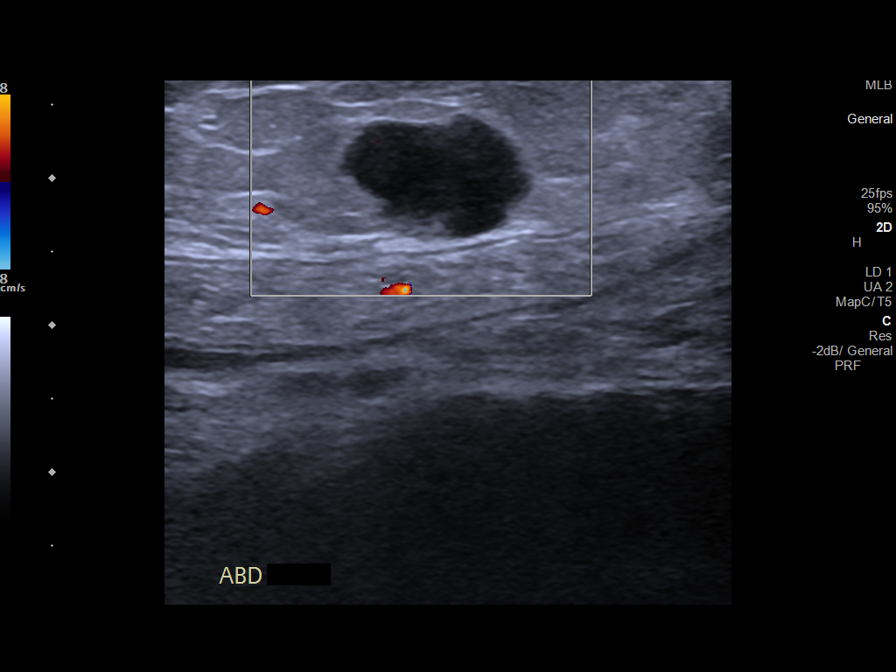
[im 2/9]
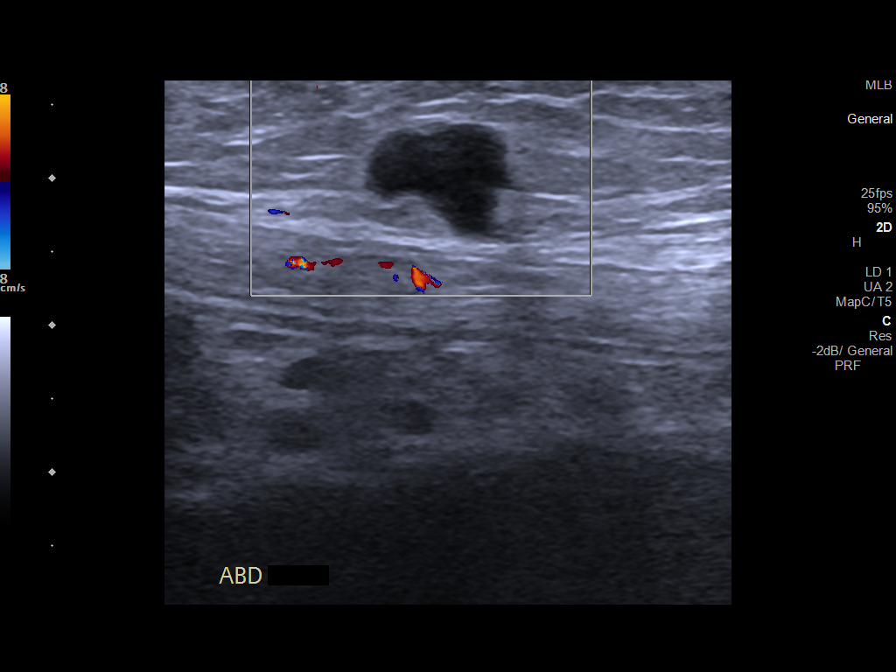
[im 3/9]
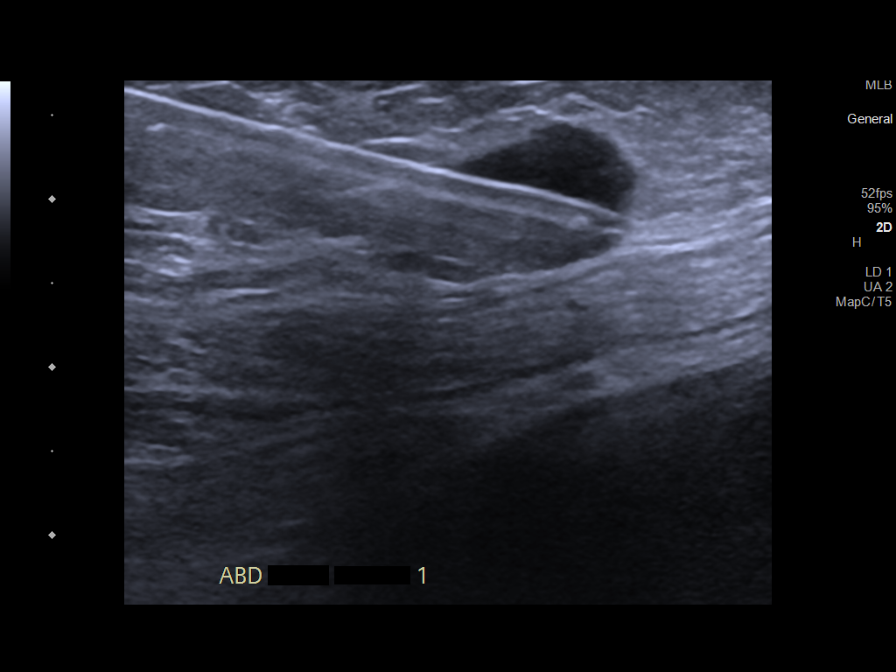
[im 4/9]
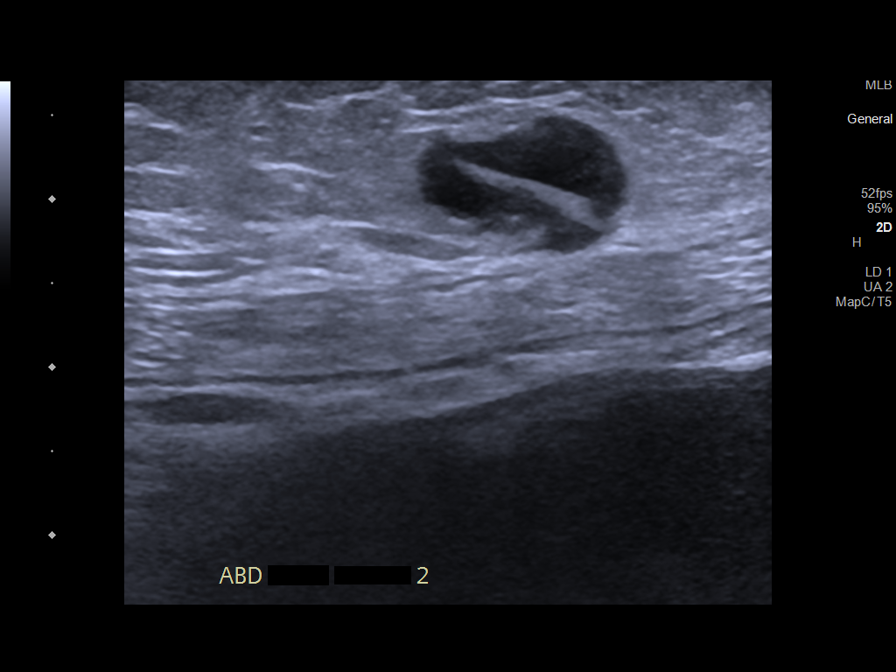
[im 5/9]
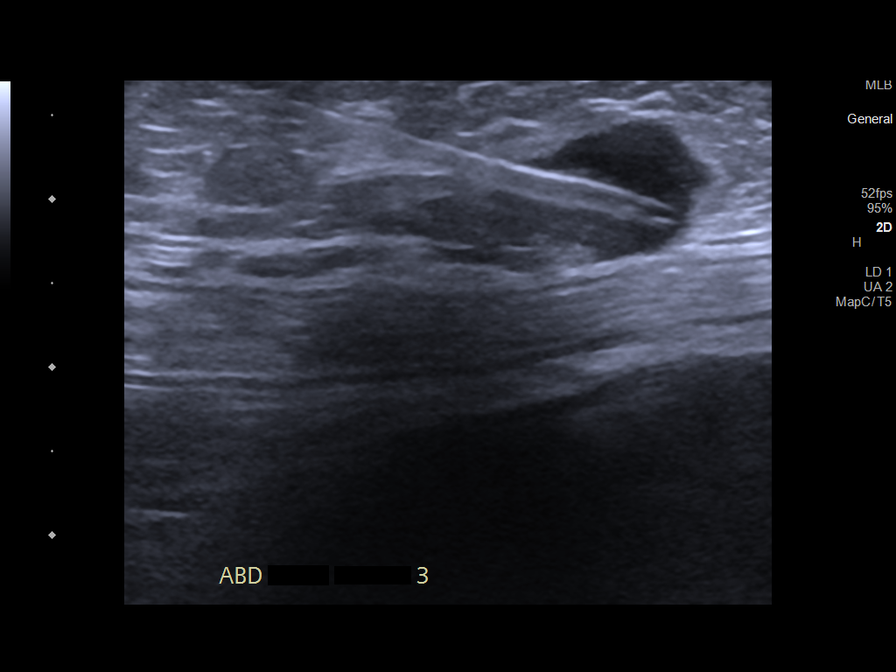
[im 6/9]
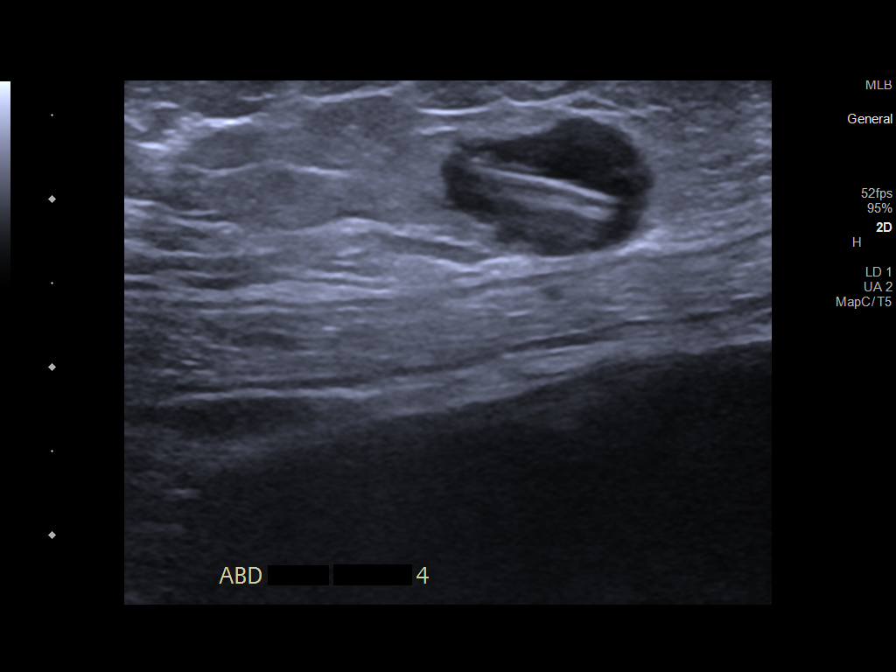
[im 7/9]
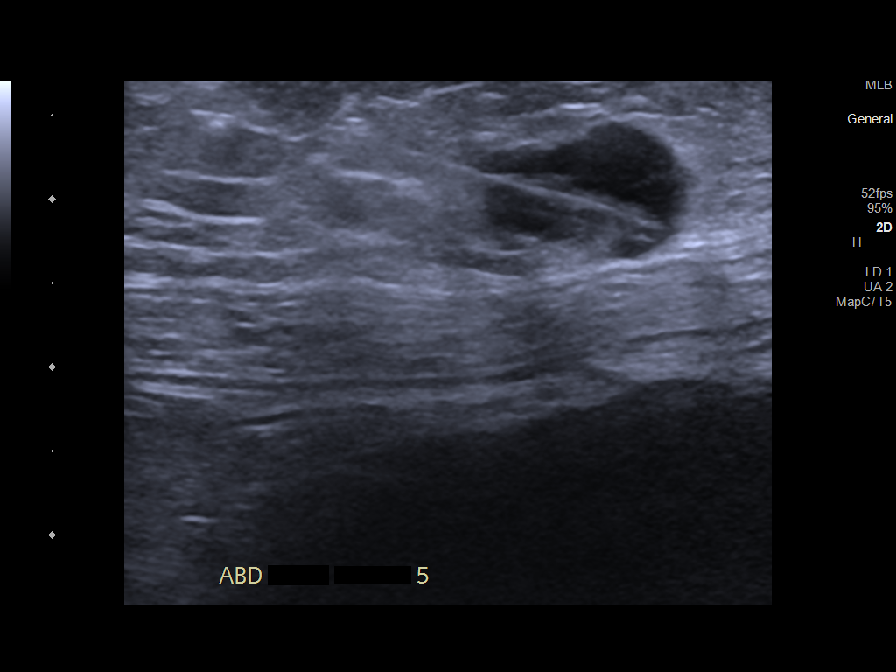
[im 8/9]
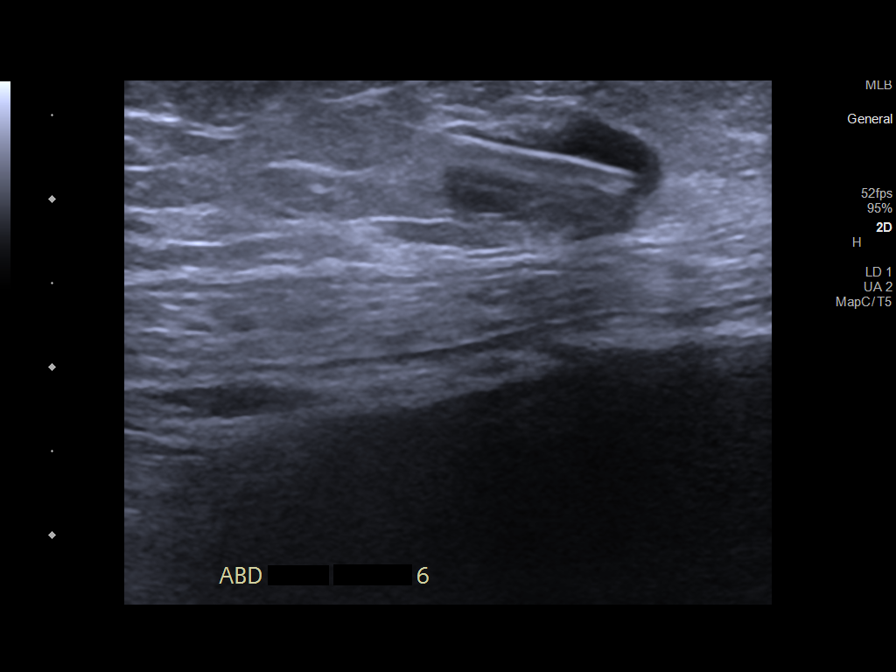
[im 9/9]
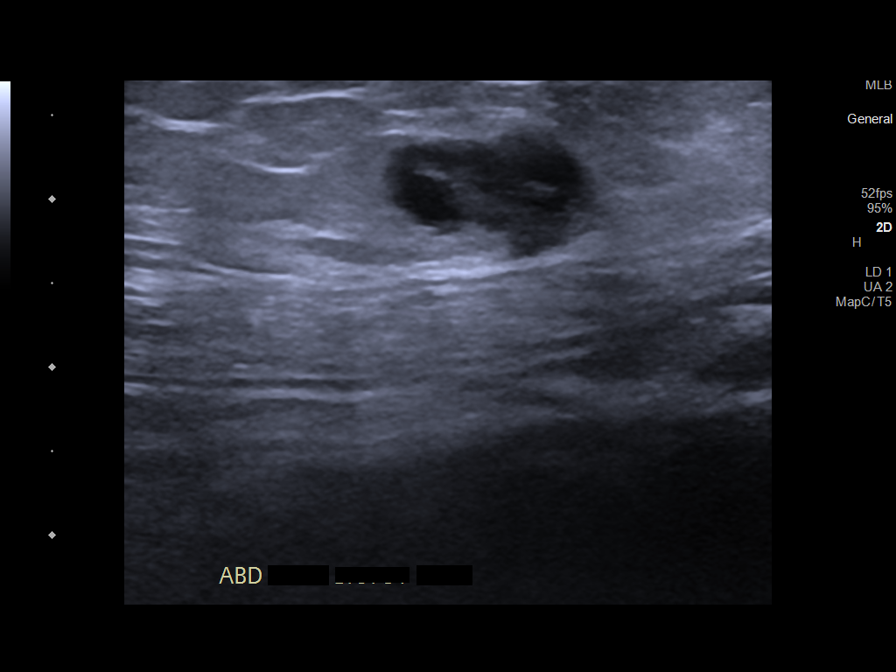

[9 of 9 positions shown; findings below may reference images not displayed]

EXAM:
ULTRASOUND-GUIDED CORE BIOPSY OF SUBCUTANEOUS NODULE

MEDICATIONS:
None.

ANESTHESIA/SEDATION:
Moderate (conscious) sedation was employed during this procedure. A
total of Versed 2.0 mg and Fentanyl 100 mcg was administered
intravenously.

Moderate Sedation Time: 15 minutes. The patient's level of
consciousness and vital signs were monitored continuously by
radiology nursing throughout the procedure under my direct
supervision.

FLUOROSCOPY TIME:  Fluoroscopy Time: None

COMPLICATIONS:
None immediate.

PROCEDURE:
Informed written consent was obtained from the patient after a
thorough discussion of the procedural risks, benefits and
alternatives. All questions were addressed. A timeout was performed
prior to the initiation of the procedure.

Ultrasound demonstrated a suspicious nodule in the right
periumbilical subcutaneous tissue. The skin was prepped with
chlorhexidine and sterile field was created. Skin and soft tissues
were anesthetized with 1% lidocaine. 18 gauge core needle was
directed into the lesion with ultrasound guidance. Core biopsies
were obtained and placed in formalin. A total of 6 core biopsies
were obtained with an 18 gauge device. Bandage placed over the
puncture site.
FINDINGS: Lobulated hypoechoic subcutaneous nodule in the right periumbilical
region. No significant bleeding or hematoma formation following the
core biopsies.
IMPRESSION: Ultrasound-guided core biopsies of the periumbilical subcutaneous
nodule.
